# Patient Record
Sex: Female | Born: 1935 | Race: White | Hispanic: No | Smoking: Former smoker
Health system: Southern US, Community
[De-identification: ages and names within clinical notes are randomized; demographics above are authoritative.]

## PROBLEM LIST (undated history)

## (undated) DIAGNOSIS — I2510 Atherosclerotic heart disease of native coronary artery without angina pectoris: Secondary | ICD-10-CM

## (undated) DIAGNOSIS — C801 Malignant (primary) neoplasm, unspecified: Secondary | ICD-10-CM

## (undated) DIAGNOSIS — M1990 Unspecified osteoarthritis, unspecified site: Secondary | ICD-10-CM

## (undated) DIAGNOSIS — I679 Cerebrovascular disease, unspecified: Secondary | ICD-10-CM

## (undated) DIAGNOSIS — I701 Atherosclerosis of renal artery: Secondary | ICD-10-CM

## (undated) DIAGNOSIS — E785 Hyperlipidemia, unspecified: Secondary | ICD-10-CM

## (undated) DIAGNOSIS — I639 Cerebral infarction, unspecified: Secondary | ICD-10-CM

## (undated) DIAGNOSIS — I739 Peripheral vascular disease, unspecified: Secondary | ICD-10-CM

## (undated) DIAGNOSIS — Q438 Other specified congenital malformations of intestine: Secondary | ICD-10-CM

## (undated) HISTORY — PX: COLONOSCOPY W/ POLYPECTOMY: SHX1380

## (undated) HISTORY — DX: Atherosclerotic heart disease of native coronary artery without angina pectoris: I25.10

## (undated) HISTORY — DX: Peripheral vascular disease, unspecified: I73.9

## (undated) HISTORY — DX: Hyperlipidemia, unspecified: E78.5

## (undated) HISTORY — DX: Atherosclerosis of renal artery: I70.1

## (undated) HISTORY — DX: Cerebrovascular disease, unspecified: I67.9

---

## 1981-07-03 HISTORY — PX: OTHER SURGICAL HISTORY: SHX169

## 1994-07-03 HISTORY — PX: GALLBLADDER SURGERY: SHX652

## 2000-12-10 ENCOUNTER — Ambulatory Visit (HOSPITAL_COMMUNITY): Admission: RE | Admit: 2000-12-10 | Discharge: 2000-12-10 | Payer: Self-pay | Admitting: Pulmonary Disease

## 2001-11-11 ENCOUNTER — Ambulatory Visit (HOSPITAL_COMMUNITY): Admission: RE | Admit: 2001-11-11 | Discharge: 2001-11-11 | Payer: Self-pay | Admitting: Cardiology

## 2001-11-11 ENCOUNTER — Encounter: Payer: Self-pay | Admitting: Cardiology

## 2003-03-06 ENCOUNTER — Ambulatory Visit (HOSPITAL_COMMUNITY): Admission: RE | Admit: 2003-03-06 | Discharge: 2003-03-06 | Payer: Self-pay | Admitting: *Deleted

## 2003-03-06 ENCOUNTER — Encounter: Payer: Self-pay | Admitting: *Deleted

## 2004-02-16 ENCOUNTER — Ambulatory Visit (HOSPITAL_COMMUNITY): Admission: RE | Admit: 2004-02-16 | Discharge: 2004-02-16 | Payer: Self-pay | Admitting: Pulmonary Disease

## 2004-10-03 ENCOUNTER — Ambulatory Visit: Payer: Self-pay

## 2004-10-25 ENCOUNTER — Ambulatory Visit (HOSPITAL_COMMUNITY): Admission: RE | Admit: 2004-10-25 | Discharge: 2004-10-25 | Payer: Self-pay | Admitting: Pulmonary Disease

## 2004-12-01 HISTORY — PX: OTHER SURGICAL HISTORY: SHX169

## 2004-12-13 ENCOUNTER — Ambulatory Visit: Payer: Self-pay | Admitting: Cardiology

## 2004-12-21 ENCOUNTER — Ambulatory Visit: Payer: Self-pay | Admitting: Cardiology

## 2004-12-21 ENCOUNTER — Ambulatory Visit: Payer: Self-pay

## 2004-12-28 ENCOUNTER — Inpatient Hospital Stay (HOSPITAL_COMMUNITY): Admission: RE | Admit: 2004-12-28 | Discharge: 2005-01-03 | Payer: Self-pay | Admitting: Orthopedic Surgery

## 2004-12-29 ENCOUNTER — Ambulatory Visit: Payer: Self-pay | Admitting: Orthopedic Surgery

## 2005-03-01 ENCOUNTER — Ambulatory Visit: Payer: Self-pay | Admitting: Cardiology

## 2005-08-09 ENCOUNTER — Inpatient Hospital Stay (HOSPITAL_COMMUNITY): Admission: RE | Admit: 2005-08-09 | Discharge: 2005-08-11 | Payer: Self-pay | Admitting: Orthopedic Surgery

## 2005-10-18 ENCOUNTER — Ambulatory Visit: Payer: Self-pay

## 2005-12-01 ENCOUNTER — Ambulatory Visit: Payer: Self-pay | Admitting: Cardiology

## 2005-12-07 ENCOUNTER — Ambulatory Visit: Payer: Self-pay | Admitting: Cardiology

## 2006-01-15 ENCOUNTER — Ambulatory Visit (HOSPITAL_COMMUNITY): Admission: RE | Admit: 2006-01-15 | Discharge: 2006-01-15 | Payer: Self-pay | Admitting: Ophthalmology

## 2006-10-23 ENCOUNTER — Ambulatory Visit: Payer: Self-pay

## 2006-10-23 ENCOUNTER — Ambulatory Visit: Payer: Self-pay | Admitting: Cardiology

## 2006-10-30 ENCOUNTER — Emergency Department (HOSPITAL_COMMUNITY): Admission: EM | Admit: 2006-10-30 | Discharge: 2006-10-30 | Payer: Self-pay | Admitting: Emergency Medicine

## 2007-10-03 ENCOUNTER — Ambulatory Visit: Payer: Self-pay | Admitting: Cardiology

## 2007-10-17 ENCOUNTER — Ambulatory Visit: Payer: Self-pay

## 2008-04-06 ENCOUNTER — Ambulatory Visit: Payer: Self-pay | Admitting: Cardiology

## 2008-10-21 ENCOUNTER — Ambulatory Visit: Payer: Self-pay

## 2008-11-11 ENCOUNTER — Encounter: Payer: Self-pay | Admitting: Cardiology

## 2008-11-11 ENCOUNTER — Ambulatory Visit: Payer: Self-pay | Admitting: Cardiology

## 2008-12-07 ENCOUNTER — Telehealth (INDEPENDENT_AMBULATORY_CARE_PROVIDER_SITE_OTHER): Payer: Self-pay

## 2008-12-21 ENCOUNTER — Telehealth: Payer: Self-pay | Admitting: Cardiology

## 2008-12-30 DIAGNOSIS — E785 Hyperlipidemia, unspecified: Secondary | ICD-10-CM | POA: Insufficient documentation

## 2008-12-30 DIAGNOSIS — I701 Atherosclerosis of renal artery: Secondary | ICD-10-CM

## 2008-12-30 DIAGNOSIS — I679 Cerebrovascular disease, unspecified: Secondary | ICD-10-CM

## 2008-12-30 DIAGNOSIS — I2510 Atherosclerotic heart disease of native coronary artery without angina pectoris: Secondary | ICD-10-CM | POA: Insufficient documentation

## 2008-12-30 DIAGNOSIS — I739 Peripheral vascular disease, unspecified: Secondary | ICD-10-CM

## 2009-02-16 ENCOUNTER — Encounter: Payer: Self-pay | Admitting: Cardiology

## 2009-02-25 ENCOUNTER — Encounter: Payer: Self-pay | Admitting: Cardiology

## 2009-02-25 ENCOUNTER — Ambulatory Visit: Payer: Self-pay | Admitting: Cardiology

## 2009-02-25 DIAGNOSIS — R0989 Other specified symptoms and signs involving the circulatory and respiratory systems: Secondary | ICD-10-CM

## 2009-03-05 ENCOUNTER — Encounter: Payer: Self-pay | Admitting: Cardiology

## 2009-03-05 ENCOUNTER — Ambulatory Visit (HOSPITAL_COMMUNITY): Admission: RE | Admit: 2009-03-05 | Discharge: 2009-03-05 | Payer: Self-pay | Admitting: Cardiology

## 2009-04-20 ENCOUNTER — Ambulatory Visit: Payer: Self-pay | Admitting: Cardiology

## 2009-04-20 ENCOUNTER — Inpatient Hospital Stay (HOSPITAL_COMMUNITY): Admission: AD | Admit: 2009-04-20 | Discharge: 2009-04-24 | Payer: Self-pay | Admitting: Pulmonary Disease

## 2009-04-21 ENCOUNTER — Encounter: Payer: Self-pay | Admitting: Neurology

## 2009-05-28 ENCOUNTER — Ambulatory Visit: Payer: Self-pay | Admitting: Cardiology

## 2009-05-28 DIAGNOSIS — I2510 Atherosclerotic heart disease of native coronary artery without angina pectoris: Secondary | ICD-10-CM

## 2009-05-31 ENCOUNTER — Telehealth (INDEPENDENT_AMBULATORY_CARE_PROVIDER_SITE_OTHER): Payer: Self-pay

## 2009-07-05 ENCOUNTER — Telehealth: Payer: Self-pay | Admitting: Cardiology

## 2009-08-24 ENCOUNTER — Telehealth (INDEPENDENT_AMBULATORY_CARE_PROVIDER_SITE_OTHER): Payer: Self-pay | Admitting: *Deleted

## 2009-11-30 ENCOUNTER — Telehealth: Payer: Self-pay | Admitting: Cardiology

## 2009-12-10 ENCOUNTER — Telehealth: Payer: Self-pay | Admitting: Cardiology

## 2009-12-24 ENCOUNTER — Encounter: Payer: Self-pay | Admitting: Cardiology

## 2010-01-18 ENCOUNTER — Ambulatory Visit: Payer: Self-pay | Admitting: Cardiology

## 2010-03-22 ENCOUNTER — Emergency Department (HOSPITAL_COMMUNITY): Admission: EM | Admit: 2010-03-22 | Discharge: 2010-03-22 | Payer: Self-pay | Admitting: Emergency Medicine

## 2010-04-06 ENCOUNTER — Telehealth: Payer: Self-pay | Admitting: Cardiology

## 2010-04-18 ENCOUNTER — Observation Stay (HOSPITAL_COMMUNITY): Admission: EM | Admit: 2010-04-18 | Discharge: 2010-04-19 | Payer: Self-pay | Admitting: Emergency Medicine

## 2010-07-31 LAB — CONVERTED CEMR LAB
ALT: 14 units/L (ref 0–35)
Albumin: 4 g/dL (ref 3.5–5.2)
BUN: 12 mg/dL (ref 6–23)
Bilirubin, Direct: 0.2 mg/dL (ref 0.0–0.3)
CO2: 32 meq/L (ref 19–32)
Calcium: 9.3 mg/dL (ref 8.4–10.5)
Cholesterol: 132 mg/dL (ref 0–200)
GFR calc Af Amer: 88 mL/min
Glucose, Bld: 100 mg/dL — ABNORMAL HIGH (ref 70–99)
HDL: 59.7 mg/dL (ref >39.0)
Sodium: 140 meq/L (ref 135–145)
Total Protein: 6.5 g/dL (ref 6.0–8.3)
Triglycerides: 81 mg/dL (ref 0–149)
VLDL: 16 mg/dL (ref 0–40)

## 2010-08-04 NOTE — Letter (Signed)
Summary: progress note dr Juanetta Gosling 12-22-09  progress note dr Juanetta Gosling 12-22-09   Imported By: Faythe Ghee 12/24/2009 09:59:06  _____________________________________________________________________  External Attachment:    Type:   Image     Comment:   External Document

## 2010-08-04 NOTE — Progress Notes (Signed)
Summary: meds on back order - need rx to [facility]  Phone Note Refill Request Call back at [facility] Phone [phone] Message from:  Patient on Nov 30, 2009 12:41 PM  Refills Requested: Medication #1:  ACEON 8 MG TABS Take 2 tablet by mouth once a day  Method Requested: Fax to Local Pharmacy Reason for Call: Talk to Nurse Summary of Call: meds on back order. need an extended release. [facility] [phone] Initial call taken by: Lorne Skeens,  Nov 30, 2009 12:42 PM  Follow-up for Phone Call        Spoke with [facility] Wynona Canes ?)  RX given for pt to have Aceon 8 mg one by mouth two times a day # 60 with 3 refills.  Pt was not able to get it from mail order d/t back order.  pt aware Follow-up by: Charolotte Capuchin, RN,  Nov 30, 2009 1:22 PM

## 2010-08-04 NOTE — Progress Notes (Signed)
Summary: rx refill  Phone Note Call from Patient Call back at Home Phone [phone]   Caller: pt Reason for Call: Refill Medication Summary of Call: pt needs aceon 8mg  called in to [facility] and she would like #90 days instead of #30 Initial call taken by: Faythe Ghee,  April 06, 2010 10:13 AM    New/Updated Medications: ACEON 8 MG TABS (PERINDOPRIL ERBUMINE) Take 2 tablet by mouth once a day Prescriptions: ACEON 8 MG TABS (PERINDOPRIL ERBUMINE) Take 2 tablet by mouth once a day  #180 x 2   Entered by:   Larita Fife Via LPN   Authorized by:   Gaylord Shih, MD, [facility]   Signed by:   Larita Fife Via LPN on 09/81/1914   Method used:   Electronically to        [facility]* (retail)       [address]       [place], [place]  [address]       Ph: [phone]       Fax: [phone]   RxID:   [id number]

## 2010-08-04 NOTE — Progress Notes (Signed)
Summary: pt needs refill  Phone Note Refill Request Message from:  Patient on [facility]  Refills Requested: Medication #1:  VYTORIN 10-40 MG TABS Take 1 tablet by mouth every night  Medication #2:  PLETAL 100 MG TABS Take 1 tablet by mouth twice a day Initial call taken by: Omer Jack,  December 10, 2009 12:08 PM    Prescriptions: VYTORIN 10-40 MG TABS (EZETIMIBE-SIMVASTATIN) Take 1 tablet by mouth every night  #90 x 3   Entered by:   Danielle Rankin, CMA   Authorized by:   Gaylord Shih, MD, [facility]   Signed by:   Danielle Rankin, CMA on 12/10/2009   Method used:   Electronically to        [facility]* (mail-order)             ,          Ph: [phone]       Fax: [phone]   RxID:   [id number] PLETAL 100 MG TABS (CILOSTAZOL) Take 1 tablet by mouth twice a day  #90 x 3   Entered by:   Danielle Rankin, CMA   Authorized by:   Gaylord Shih, MD, [facility]   Signed by:   Danielle Rankin, CMA on 12/10/2009   Method used:   Electronically to        [facility]* (mail-order)             ,          Ph: [phone]       Fax: [phone]   RxID:   [id number]

## 2010-08-04 NOTE — Assessment & Plan Note (Signed)
Summary: Beto.Dent   Primary Provider:  Nehemiah Settle   History of Present Illness: Ruth Baker returns for E and M of her multiple vascular issues. She is miserable because of numerous restictions including dependency on a walker and not able to drive. She denies CP,angina, claudications or sxs of a TIA.  Current Medications (verified): 1)  Furosemide 20 Mg Tabs (Furosemide) .... Take 2 Tablet By Mouth Daily. 2)  Potassium Chloride Crys Cr 20 Meq Cr-Tabs (Potassium Chloride Crys Cr) .... Take One Tablet By Mouth Daily 3)  Aceon 8 Mg Tabs (Perindopril Erbumine) .... Take 2 Tablet By Mouth Once A Day 4)  Plavix 75 Mg Tabs (Clopidogrel Bisulfate) .... Take 1 Tablet By Mouth Once A Day 5)  Pletal 100 Mg Tabs (Cilostazol) .... Take 1 Tablet By Mouth Twice A Day 6)  Toprol Xl 50 Mg Xr24h-Tab (Metoprolol Succinate) .... Take 1 Tablet By Mouth Once A Day 7)  Vytorin 10-40 Mg Tabs (Ezetimibe-Simvastatin) .... Take 1 Tablet By Mouth Every Night 8)  Alprazolam 0.25 Mg Tabs (Alprazolam) .... Take As Needed 9)  Sertraline Hcl 50 Mg Tabs (Sertraline Hcl) .... Take 1 Tab Daily 10)  Aspirin 81 Mg Tbec (Aspirin) .... Take One Tablet By Mouth Daily 11)  Hydrocodone-Acetaminophen 5-500 Mg Tabs (Hydrocodone-Acetaminophen) .... As Needed 12)  Miralax  Powd (Polyethylene Glycol 3350) .... As Needed  Allergies (verified): 1)  ! Morphine 2)  ! Rocephin  Past History:  Past Medical History: Last updated: 12/30/2008 CEREBROVASCULAR DISEASE (ICD-437.9) PVD (ICD-443.9) HYPERLIPIDEMIA-MIXED (ICD-272.4) RENAL ARTERY STENOSIS (ICD-440.1) CAD, UNSPECIFIED SITE (ICD-414.00)    Past Surgical History: Last updated: 12/30/2008 Gallbladder surgery in 1996. Hysterectomy in 1983. Colonoscopy with polypectomy.  Left total hip in June of 2006  Family History: Last updated: 12/30/2008 Family History of Cancer:   Social History: Last updated: 12/30/2008 Full Time Widowed  Tobacco Use - Former.  Alcohol Use  - no  Risk Factors: Smoking Status: quit (12/30/2008)  Review of Systems       Negative other than HPI  Vital Signs:  Patient profile:   77 year old female Height:      64 inches Weight:      131 pounds BMI:     22.57 Pulse rate:   80 / minute Resp:     16 per minute BP sitting:   133 / 58  (left arm)  Vitals Entered By: Marrion Coy, CNA (January 18, 2010 1:58 PM)  Physical Exam  General:  Frail, elderly. NAD Head:  normocephalic and atraumatic Eyes:  PERRLA/EOM intact; conjunctiva and lids normal. Neck:  Neck supple, no JVD. No masses, thyromegaly or abnormal cervical nodes. Chest Maragret Vanacker:  no deformities or breast masses noted Lungs:  DECREASED BS THROUGHOUT. Heart:  Non-displaced PMI, chest non-tender; regular rate and rhythm, S1, S2 without murmurs, rubs or gallops. Carotid upstroke normal, right bruit Normal abdominal aortic size, no bruits. Femorals normal pulses, no bruits. Pedals normal pulses. No edema, no varicosities. Msk:  decreased ROM.   Pulses:  PT palpable Extremities:  Chronic edematous changes, Varicosities Neurologic:  Alert and oriented x 3. Skin:  Intact without lesions or rashes. Psych:  Normal affect.   Impression & Recommendations:  Problem # 1:  CAD, NATIVE VESSEL (ICD-414.01) Assessment Unchanged  Her updated medication list for this problem includes:    Aceon 8 Mg Tabs (Perindopril erbumine) .Marland Kitchen... Take 2 tablet by mouth once a day    Plavix 75 Mg Tabs (Clopidogrel bisulfate) .Marland Kitchen... Take 1 tablet by  mouth once a day    Pletal 100 Mg Tabs (Cilostazol) .Marland Kitchen... Take 1 tablet by mouth twice a day    Toprol Xl 50 Mg Xr24h-tab (Metoprolol succinate) .Marland Kitchen... Take 1 tablet by mouth once a day    Aspirin 81 Mg Tbec (Aspirin) .Marland Kitchen... Take one tablet by mouth daily  Problem # 2:  CAROTID BRUIT (ICD-785.9) Assessment: Unchanged  Problem # 3:  CEREBROVASCULAR DISEASE (ICD-437.9) Assessment: Unchanged  Problem # 4:  PVD (ICD-443.9) Assessment:  Unchanged  Problem # 5:  HYPERLIPIDEMIA-MIXED (ICD-272.4)  Her updated medication list for this problem includes:    Vytorin 10-40 Mg Tabs (Ezetimibe-simvastatin) .Marland Kitchen... Take 1 tablet by mouth every night  Problem # 6:  RENAL ARTERY STENOSIS (ICD-440.1) Assessment: Unchanged  Patient Instructions: 1)  Your physician recommends that you schedule a follow-up appointment in: 1 year 2)  Your physician recommends that you continue on your current medications as directed. Please refer to the Current Medication list given to you today.

## 2010-08-04 NOTE — Progress Notes (Signed)
Summary: rx pletal [facility]  Phone Note Refill Request Message from:  Patient on July 05, 2009 11:50 AM  Refills Requested: Medication #1:  PLETAL 100 MG TABS Take 1 tablet by mouth twice a day   Supply Requested: 3 months [facility] Mail Order   Method Requested: Fax to [facility] Initial call taken by: Migdalia Dk,  July 05, 2009 11:50 AM    Prescriptions: PLETAL 100 MG TABS (CILOSTAZOL) Take 1 tablet by mouth twice a day  #90 x 3   Entered by:   Danielle Rankin, CMA   Authorized by:   Gaylord Shih, MD, [facility]   Signed by:   Danielle Rankin, CMA on 07/05/2009   Method used:   Electronically to        [facility]* (mail-order)             ,          Ph: [phone]       Fax: [phone]   RxID:   [id number]

## 2010-08-04 NOTE — Progress Notes (Signed)
Summary: RX REFILLS  Phone Note Call from Patient Call back at Home Phone [phone]   Caller: PT Reason for Call: Refill Medication Summary of Call: ACEON 8MG , PLAVIX 75MG , TOPROL 50MG  ALL NEED CALLED IN TO [facility] Initial call taken by: Faythe Ghee,  August 24, 2009 10:51 AM    Prescriptions: TOPROL XL 50 MG XR24H-TAB (METOPROLOL SUCCINATE) Take 1 tablet by mouth once a day  #90 x 3   Entered by:   Teressa Lower RN   Authorized by:   Gaylord Shih, MD, [facility]   Signed by:   Teressa Lower RN on 08/24/2009   Method used:   Electronically to        [facility]* (mail-order)             ,          Ph: [phone]       Fax: [phone]   RxID:   [id number] PLAVIX 75 MG TABS (CLOPIDOGREL BISULFATE) Take 1 tablet by mouth once a day  #90 x 3   Entered by:   Teressa Lower RN   Authorized by:   Gaylord Shih, MD, [facility]   Signed by:   Teressa Lower RN on 08/24/2009   Method used:   Electronically to        [facility]* (mail-order)             ,          Ph: [phone]       Fax: [phone]   RxID:   [id number] ACEON 8 MG TABS (PERINDOPRIL ERBUMINE) Take 2 tablet by mouth once a day  #180 x 3   Entered by:   Teressa Lower RN   Authorized by:   Gaylord Shih, MD, [facility]   Signed by:   Teressa Lower RN on 08/24/2009   Method used:   Electronically to        [facility]* (mail-order)             ,          Ph: [phone]       Fax: [phone]   RxID:   [id number]

## 2010-09-10 ENCOUNTER — Telehealth: Payer: Self-pay | Admitting: Cardiology

## 2010-09-14 LAB — CBC
Hemoglobin: 12.9 g/dL (ref 12.0–15.0)
MCHC: 33.6 g/dL (ref 30.0–36.0)
RDW: 15.5 % (ref 11.5–15.5)
WBC: 7.6 10*3/uL (ref 4.0–10.5)

## 2010-09-14 LAB — CK: Total CK: 30 U/L (ref 7–177)

## 2010-09-14 LAB — BASIC METABOLIC PANEL
BUN: 18 mg/dL (ref 6–23)
Calcium: 9.5 mg/dL (ref 8.4–10.5)
GFR calc non Af Amer: 55 mL/min — ABNORMAL LOW (ref >60)
Glucose, Bld: 108 mg/dL — ABNORMAL HIGH (ref 70–99)
Potassium: 3.4 mEq/L — ABNORMAL LOW (ref 3.5–5.1)
Sodium: 142 mEq/L (ref 135–145)

## 2010-09-15 LAB — URINE MICROSCOPIC-ADD ON

## 2010-09-15 LAB — URINALYSIS, ROUTINE W REFLEX MICROSCOPIC
Bilirubin Urine: NEGATIVE
Glucose, UA: NEGATIVE mg/dL
Hgb urine dipstick: NEGATIVE
Ketones, ur: NEGATIVE mg/dL
Nitrite: NEGATIVE
Protein, ur: NEGATIVE mg/dL
Specific Gravity, Urine: 1.015 (ref 1.005–1.030)
Urobilinogen, UA: 0.2 mg/dL (ref 0.0–1.0)
pH: 5.5 (ref 5.0–8.0)

## 2010-09-15 LAB — URINE CULTURE
Colony Count: >=100000
Culture  Setup Time: 201109210134

## 2010-09-20 NOTE — Progress Notes (Signed)
  Phone Note Other Incoming   Caller: [facility] drug information forms Summary of Call: Consideration for your Review per [facility]:  [facility] wanted to bring to your attention and clarify that Ruth Baker was on both plavix and pletal both platelet aggregation inhibitors. Pt has been on both  since 2010,  if making med changes please send note back to me  Initial call taken by: Teressa Lower RN,  September 10, 2010 2:50 PM  Follow-up for Phone Call        ok with me Follow-up by: Gaylord Shih, MD, [facility],  September 13, 2010 11:52 AM

## 2010-10-06 LAB — BASIC METABOLIC PANEL
CO2: 26 mEq/L (ref 19–32)
GFR calc Af Amer: >60 mL/min (ref >60)
Glucose, Bld: 100 mg/dL — ABNORMAL HIGH (ref 70–99)
Potassium: 3.8 mEq/L (ref 3.5–5.1)
Sodium: 142 mEq/L (ref 135–145)

## 2010-10-06 LAB — CBC
HCT: 41.8 % (ref 36.0–46.0)
Hemoglobin: 14.2 g/dL (ref 12.0–15.0)
MCHC: 34 g/dL (ref 30.0–36.0)
MCV: 94.3 fL (ref 78.0–100.0)
RDW: 15.2 % (ref 11.5–15.5)

## 2010-10-06 LAB — CARDIAC PANEL(CRET KIN+CKTOT+MB+TROPI): Total CK: 51 U/L (ref 7–177)

## 2010-10-06 LAB — COMPREHENSIVE METABOLIC PANEL
BUN: 11 mg/dL (ref 6–23)
Calcium: 9.1 mg/dL (ref 8.4–10.5)
Creatinine, Ser: 0.82 mg/dL (ref 0.4–1.2)
GFR calc non Af Amer: >60 mL/min (ref >60)
Glucose, Bld: 113 mg/dL — ABNORMAL HIGH (ref 70–99)
Sodium: 143 mEq/L (ref 135–145)
Total Protein: 6.7 g/dL (ref 6.0–8.3)

## 2010-10-06 LAB — URINALYSIS, ROUTINE W REFLEX MICROSCOPIC
Bilirubin Urine: NEGATIVE
Nitrite: NEGATIVE
Specific Gravity, Urine: 1.01 (ref 1.005–1.030)
pH: 6 (ref 5.0–8.0)

## 2010-10-06 LAB — DIFFERENTIAL
Lymphocytes Relative: 28 % (ref 12–46)
Lymphs Abs: 2.1 10*3/uL (ref 0.7–4.0)
Monocytes Relative: 7 % (ref 3–12)
Neutro Abs: 4.7 10*3/uL (ref 1.7–7.7)
Neutrophils Relative %: 63 % (ref 43–77)

## 2010-10-06 LAB — URINE CULTURE
Colony Count: 15000
Special Requests: NEGATIVE

## 2010-10-06 LAB — URINE MICROSCOPIC-ADD ON

## 2010-11-01 ENCOUNTER — Telehealth: Payer: Self-pay | Admitting: *Deleted

## 2010-11-01 MED ORDER — CILOSTAZOL 100 MG PO TABS: 100.0000 mg | ORAL_TABLET | Freq: Two times a day (BID) | ORAL | Status: DC

## 2010-11-01 NOTE — Telephone Encounter (Signed)
Received refill for Cilostazol 100mg   90/2.  I called pt and clarified medication and dosage.  She is taking Pletal 100mg  bid.  I will fax 180/3 to [facility] for pt.  Pt also due for yearly with Dr. Daleen Squibb.  Appt made for LRD on 12/21/10. Mylo Red RN

## 2010-11-15 NOTE — Assessment & Plan Note (Signed)
[facility]                       [place] CARDIOLOGY OFFICE NOTE   Ruth Baker, Ruth Baker                        MRN:          [id number]  DATE:11/11/2008                            DOB:          [birth year]    Ruth Baker comes in today for followup.   Other than multiple arthritic complaints, she is doing well.  She is  having no angina or ischemic symptoms.  She has had no symptoms of TIAs  or mini strokes.  Her blood pressure has been under good control.  She  is due for blood work, which we checked on last year.  She is not having  any claudication.   Her problem list, see my note from October 03, 2007.   CURRENT MEDICATIONS:  1. HCTZ 25 mg per day.  2. Plavix 75 mg per day.  3. Toprol-XL 50 mg a day.  4. Pletal 100 mg p.o. b.i.d.  5. Potassium 10 mEq p.o. daily.  6. Zoloft 50 mg p.o. daily.  7. Vytorin 10/40 daily.  8. Alprazolam 0.25 two daily.  9. Aceon 16 mg per day.   PHYSICAL EXAMINATION:  VITAL SIGNS:  Blood pressure today is 124/78,  pulse is 60 and regular.  Her weight is 143 pounds.  GENERAL:  She is alert and oriented, quite humorous.  SKIN:  Very thin and ecchymotic particularly upper extremities.  HEENT:  Unchanged.  NECK:  Supple.  Carotids upstrokes are equal bilaterally without  significant bruits.  Thyroid is not enlarged.  Trachea is midline.  LUNGS:  Clear to auscultation and percussion.  No rhonchi or rales.  HEART:  Reveals a regular rate and rhythm.  No gallop, murmur, or rub.  ABDOMEN:  Soft, good bowel sounds.  No midline bruit.  No hepatomegaly.  EXTREMITIES:  Reveals ecchymoses.  Her pulses were trace bilaterally in  the lower extremities.  She has 2+ pitting edema pretibially.  There is  no sign of DVT.  NEUROLOGIC:  Intact.   Carotid Dopplers were done last week in the [facility].  We are  pulling those for now.  If there has been no change, we will make no  changes in her medical program.   I am  concerned about her edema, and I will switch her HCTZ to furosemide  20 mg p.o. q.a.m.  We will increase her potassium 20 mEq daily.   We will put her in for a CMP and lipid profile next week for followup.   Assuming these are stable, I will see her back in 6 months.     Thomas C. Daleen Squibb, MD, [facility]  Electronically Signed    TCW/MedQ  DD: 11/11/2008  DT: 11/12/2008  Job #: [id number]   cc:   Ramon Dredge L. Juanetta Gosling, M.D.

## 2010-11-15 NOTE — Assessment & Plan Note (Signed)
[facility]                            CARDIOLOGY OFFICE NOTE   Ruth Baker, Ruth Baker                        MRN:          [id number]  DATE:10/03/2007                            DOB:          29-Nov-1920    [name] comes today for followup.   PROBLEM LIST:  1. Coronary artery disease.  She has normal left ventricular systolic      function.  EF 70%.  Last Myoview was December 22, 2004.  She is      asymptomatic.  We have been treating her medically.  2. Cerebrovascular disease.  She has 69% stenosis on the right, 0-30%      on left, antegrade flow in both vertebral.  She is due followup.      She is having no symptoms of TIA or threatened stroke.  3. Renal artery stenosis.  Blood pressures have actually been good.  I      do not have a recent creatinine.  4. Hyperlipidemia.  She says she has not had any blood work done in      over a year.  She is supposed to be following along with Dr.      Juanetta Gosling.  5. Peripheral vascular disease which is currently asymptomatic.   Her biggest complaint is that her legs just ache all over.  She can just  touch them and they hurt.  They even hurt when she puts on her hose.   She denies any true claudication.  She has really had no muscle  soreness.  She is not diabetic.  I have made no change in her  medications.  She denies any orthopnea, PND, peripheral edema.  She has  had no angina.   Her medicines are hydrochlorothiazide 25 mg a day, Plavix 75 mg a day,  Toprol XL 50 mg a day, Pletal 100 mg p.o. b.i.d., potassium 10 mg a day,  Zoloft 50 mg a day, Vytorin 10/40 daily, alprazolam 0.25 two daily,  Aceon 16 mg a day.   EXAM:  Her blood pressure 130/76, her pulse 71 and regular.  Weight is  146 down 3.  She is a very pleasant elderly lady in no acute distress.  HEENT:  PERRL.  Extraocular is intact.  She has arcus senilis.  Sclerae  are slightly injected.  Carotid upstrokes were equal bilaterally with soft bruit on  the right.  No JVD.  Thyroid is not enlarged.  Trachea is midline.  She has a scar  from the inside lining of her mandible down to her upper left neck from  skin cancer excision.  It is well-healed.  Her lungs reveal decreased breath sounds throughout.  There are no  rhonchi or wheezes.  HEART:  Reveals nondisplaced PMI.  She has normal S1-S2 without gallop  or rub.  ABDOMEN:  Soft, good bowel sounds.  There is no obvious midline bruit or  pulsatile mass.  No organomegaly.  EXTREMITIES:  Reveal her pulses to be 1+/4+ bilaterally symmetrical.  She has some varicose veins.  There is no sign of DVT.  There is  really  not a lot of tenderness to touch.  NEURO:  Exam is intact.  Musculoskeletal shows a lot of chronic arthritic changes, particularly  of her left shoulder which is deformed.   EKG is essentially normal except for left axis deviation.  There has  been no change.   Ruth Baker is stable despite her diffuse vascular disease and multiple  complaints.  She says, I think uncle Merton Border came to visit and did not  leave.   At this point, we will obtain blood work since it has been some time and  she is requesting [place] to do so.  We will get a comprehensive metabolic  panel and lipid panel today.  In addition, we have renewed all her  medications through [facility].  We also set up for carotid Dopplers.  Assuming she is stable, I will see her back in 6 months.     Thomas C. Daleen Squibb, MD, [facility]  Electronically Signed    TCW/MedQ  DD: 10/03/2007  DT: 10/03/2007  Job #: [id number]   cc:   Ramon Dredge L. Juanetta Gosling, M.D.

## 2010-11-15 NOTE — Assessment & Plan Note (Signed)
[facility] HEALTHC[facility]                            CARDIOLOGY OFFICE NOTE   Ruth Baker, Ruth Baker                        MRN:          [id number]  DATE:04/06/2008                            DOB:          03-30-1921    Ruth Baker comes in today for followup.  Please see my problem list of  October 03, 2007.   Other than just multiple arthritic complaints particularly in left  shoulder and left arm, she has been doing well.  She has no symptoms of  angina or dyspnea on exertion.  She is fairly limited in her mobility,  however.   She had no symptoms of TIAs or threatened stroke.   CURRENT MEDICATIONS:  1. Hydrochlorothiazide 25 mg a day.  2. Plavix 75 mg a day.  3. Toprol-XL 50 mg a day.  4. Pletal 100 mg p.o. b.i.d.  5. Potassium 10 mEq daily.  6. Zoloft 50 mg daily.  7. Vytorin 10/40 daily.  8. Alprazolam 0.25 two daily.  9. Aceon 16 mg a day.  10.Cilostazol 100 mg a day.   PHYSICAL EXAMINATION:  VITAL SIGNS:  Blood pressure 140/80.  Her pulse  is 68 and regular.  Weight is 142 which is down a few.  GENERAL:  She is chronically ill, very pleasant, alert and oriented, has  a good sense of humor despite her in multiple complaints and problems.  NECK:  Carotids are full.  She has soft bilateral bruits.  Thyroid is  not enlarged.  Trachea is midline.  LUNGS:  Clear to auscultation and percussion.  HEART:  Regular rate and rhythm.  No gallop, rub, or murmur.  ABDOMEN:  Soft, good bowel sounds.  EXTREMITIES:  No edema.  Pulses were trace bilaterally in the lower  extremities.  Her toes are warm.  There is no sign of ulceration or  ulcers.   ASSESSMENT/PLAN:  Ruth Baker is stable from our standpoint.  We are trying  to treat her medically.  We have made no changes in medical program.  I  will see her back again in 6 months.     Thomas C. Daleen Squibb, MD, [facility]  Electronically Signed    TCW/MedQ  DD: 04/06/2008  DT: 04/07/2008  Job #: [id number]   cc:   Ramon Dredge L.  Juanetta Gosling, M.D.

## 2010-11-17 ENCOUNTER — Telehealth: Payer: Self-pay | Admitting: Cardiology

## 2010-11-17 NOTE — Telephone Encounter (Signed)
Pt is on Plavix and needs to know how long to be off before dental work can be done.  Please fax note to dentist indicating the number of days needed.  Fax for Dr. Melvyn Neth [phone].

## 2010-11-17 NOTE — Telephone Encounter (Signed)
Pt is having dental procedure on June 12th.  She has a couple of loose teeth and also needs fillings.  She is feeling well except for her hip.  She had a "light stroke last year before Christmas. My vision is better in my left eye".   Needs to know how long to be off Plavix before dental procedure.  She is aware Dr. Daleen Squibb is out of the office today.  Mylo Red RN

## 2010-11-18 NOTE — Op Note (Signed)
NAMETYLEAH, LOH NO.:  [id number]   MEDICAL RECORD NO.:  [id number]          PATIENT TYPE:  INP   LOCATION:  0004                         FACILITY:  [facility]   PHYSICIAN:  Ollen Gross, M.D.    DATE OF BIRTH:  1920-10-25   DATE OF PROCEDURE:  08/09/2005  DATE OF DISCHARGE:                                 OPERATIVE REPORT   PREOPERATIVE DIAGNOSIS:  Osteoarthritis right shoulder.   POSTOPERATIVE DIAGNOSIS:  Osteoarthritis right shoulder.   PROCEDURE:  Right shoulder hemiarthroplasty.   SURGEON:  Ollen Gross, M.D.   ASSISTANT:  Alexzandrew L. Julien Girt, P.A.   ANESTHESIA:  General plus interscalene block.   ESTIMATED BLOOD LOSS:  200.   DRAINS:  Hemovac x1.   COMPLICATIONS:  None.   CONDITION:  Stable to recovery.   CLINICAL NOTE:  Ruth Baker is an 77 year old female who has significant end-  stage arthritis of the right shoulder. She has had intractable pain  refractory to nonoperative management including injections. She presents now  for right shoulder hemiarthroplasty due to intractable pain. We discussed  the possibility of hemi versus total shoulder arthroplasty depending on  intraoperative findings.   PROCEDURE IN DETAIL:  After successful administration of interscalene block  and general anesthetic, the patient is placed on the operating table in a  semi beach chair position. Her right upper extremity is then isolated from  her trunk with plastic drapes and prepped and draped in the usual sterile  fashion. An incision is then made along the deltopectoral interval starting  with a 10 blade through the skin to the subcutaneous tissue. The interval  between the deltoid and pectoralis is identified. She did not have a visible  cephalic vein. We explored the interval and got down to the clavipectoral  fascia which was incised. I incised the upper 15% of the pectoralis tendon  to provide better exposure. The conjoined tendon was identified and  retracted medially. The retraction is done just under the coracoid so as to  avoid the coracobrachialis nerve. A vertical incision is then made in the  subscapularis and the medial side tagged with Ethibond sutures. The shoulder  is subsequently dislocated. I externally rotated about 30 degrees to get 30  degrees of retroversion and placed a cutting guide. The line is marked for  resection and then with the arm externally rotated 30 degrees resection is  made with an oscillating saw. We then reamed the canal up to a size 10  subsequently broaching to a 10 with excellent torsional stability. The size  10 global shoulder hemiarthroplasty was then impacted into the humerus with  excellent fit along the cut bone surface. It was very stable both in axial  direction and to torsional stressing. The trial 44 head is then placed  starting with the 44 x 18. It did not have enough offset so I went to a 44 x  21. The shoulder is reduced and has excellent stability with about 50%  posterior and 50% inferior translation. It restored her offset nicely. The  permanent 44 x 21 head is placed.  Prior to this, I had inspected the glenoid  and the glenoid was concentric. There was still cartilage left in the  glenoid. Due to the fact that it was concentric and there was still some  cartilage left, I decided not to resurface the glenoid and we just went with  a hemiarthroplasty as opposed to the total shoulder. Once the head is placed  and the shoulder is reduced, the wound is  then copiously irrigated with  saline solution. The subscap is then repaired with interrupted #1 Ethibond  sutures. The deltopectoral interval is then tacked closed with #1 Vicryl. I  placed a Hemovac drain prior to this. The subcu was closed with interrupted  2-0 Vicryl and subcuticular with running 4-0 Monocryl. Incision is cleaned  and dried and Steri-Strips and a bulky sterile dressing applied .She was  placed into a shoulder  immobilizer, awakened and transported to recovery in  stable condition.      Ollen Gross, M.D.  Electronically Signed     FA/MEDQ  D:  08/09/2005  T:  08/10/2005  Job:  [id number]

## 2010-11-18 NOTE — Discharge Summary (Signed)
NAMEMYKAH, BELLOMO                 ACCOUNT NO.:  [id number]   MEDICAL RECORD NO.:  [id number]          PATIENT TYPE:  INP   LOCATION:  1519                         FACILITY:  [facility]   PHYSICIAN:  Ollen Gross, M.D.    DATE OF BIRTH:  22-Sep-1920   DATE OF ADMISSION:  08/09/2005  DATE OF DISCHARGE:  08/11/2005                                 DISCHARGE SUMMARY   ADMISSION DIAGNOSES:  1.  Osteoarthritis, right shoulder.  2.  Anxiety.  3.  Hypertension.  4.  Coronary arterial disease.  5.  Cerebrovascular disease.  6.  Nonobstructive bilateral carotid arterial disease.  7.  Peripheral vascular disease.  8.  Hyperlipidemia.  9.  History of Bright's disease as an infant.  10. History of blood clot in leg.   DISCHARGE DIAGNOSES:  1.  Osteoarthritis, right shoulder, status post right shoulder      hemiarthroplasty.  2.  Anxiety.  3.  Hypertension.  4.  Coronary arterial disease.  5.  Cerebrovascular disease.  6.  Nonobstructive bilateral carotid arterial disease.  7.  Peripheral vascular disease.  8.  Hyperlipidemia.  9.  History of Bright's disease as an infant.  10. History of blood clot in leg.   PROCEDURE:  On August 09, 2005, right shoulder hemiarthroplasty.   SURGEON:  Ollen Gross, M.D.   ASSISTANT:  Alexzandrew L. Perkins, P.A.-C.   ANESTHESIA:  General with interscalene block.   CONSULTS:  None.   BRIEF HISTORY:  Ms. Brucato is an 77 year old female with significant end-stage  arthritis of the right shoulder with intractable pain, refractory to  nonoperative management, including injections.  Now presents for a right  shoulder hemiarthroplasty.   LABORATORY DATA:  CBC on admission:  Hemoglobin 13.4, hematocrit 39.8.  Normal white count 7.1.  Postoperative CBC showed a hemoglobin of 11.4 and  33.1.  PT/PTT on admission 13.5 and 36, respectively with an INR of 1.1.  Chem panel on admission all within normal limits.  Serial BMETs are  followed:  Potassium dropped  from 3.9 to 3.4.  Glucose went up from 105 to  142.  Preop UA:  Small leukocyte esterase with only a few epithelial cells,  0-2 white cells, a few bacteria.  Blood group type A+.   HOSPITAL COURSE:  Admitted to [facility] tolerated the procedure  well.  Later transferred to the recovery room and to the orthopedic floor  for continued postop care.  Started on p.o. and IV push analgesics for pain  control.  Was actually doing pretty well on day #1.  Did have pain  throughout the night.  Was encouraging pain medications.  Started to get up  with physical therapy.  Pressures had been running a little higher through  the night, most likely due to pain.  Hemovac drain, which was placed at the  time of surgery, was removed without difficulty.  Decrease of fluids.  Began  therapy.  O2 had been ordered postoperatively.  Recommended no active motion  or external rotations.  Started pendulum and ADLs.  Did well on day #1.  By  day #2, blood pressures were under better control.  The pain was under  better control.  The patient was ready to go home.  Set up home health and  was discharged following arrangements.   DISCHARGE PLAN:  1.  Patient was discharged home on August 11, 2005.  2.  Discharge diagnoses:  Please see above.  3.  Discharge meds:  Percocet, Robaxin.  4.  Diet:  As tolerated.  5.  Follow up next Thursday following discharge.  6.  Activity:  Home health PT/OT.  Passive range of motion only.  No active      range of motion.  No external rotation.   DISPOSITION:  Home.   CONDITION ON DISCHARGE:  Improved.      Alexzandrew L. Julien Girt, P.A.      Ollen Gross, M.D.  Electronically Signed    ALP/MEDQ  D:  09/19/2005  T:  09/19/2005  Job:  [id number]   cc:   Thomas C. Wall, M.D.  [address]  [address]  [place]  [place] [address]   Oneal Deputy. Juanetta Gosling, M.D.  Fax: [phone]

## 2010-11-18 NOTE — Op Note (Signed)
NAMEJODEL, MAYHALL NO.:  [id number]   MEDICAL RECORD NO.:  [id number]          PATIENT TYPE:  INP   LOCATION:  1614                         FACILITY:  [facility]   PHYSICIAN:  Ollen Gross, M.D.    DATE OF BIRTH:  May 25, 1921   DATE OF PROCEDURE:  12/28/2004  DATE OF DISCHARGE:                                 OPERATIVE REPORT   PREOPERATIVE DIAGNOSIS:  Osteoarthritis left hip.   POSTOPERATIVE DIAGNOSES:  Osteoarthritis left hip.   PROCEDURE:  Left total hip arthroplasty.   SURGEON:  Ollen Gross, M.D.   ASSISTANT:  Alexzandrew L. Julien Girt, P.A.   ANESTHESIA:  Spinal.   ESTIMATED BLOOD LOSS:  250.   DRAINS:  Hemovac x1.   COMPLICATIONS:  None.   CONDITION:  Stable to recovery.   BRIEF CLINICAL NOTE:  Ruth Baker is an 77 year old female who has severe end-  stage arthritis of the left hip with intractable pain. She presents now for  left total hip arthroplasty.   DESCRIPTION OF PROCEDURE:  After successful administration of spinal  anesthetic, the patient is placed in the right lateral decubitus position  with the left side up and held with the hip positioner. The left lower  extremity was isolated from her perineum with plastic drapes and prepped and  draped in the usual sterile fashion. A short posterolateral incision was  made with a 10 blade through the subcutaneous tissue to the level of the  fascia lata which is incised in line with the skin incision. The sciatic  nerve is palpated and protected and the short rotator is isolated off the  femur. Capsulectomy is performed and the hip is dislocated. The center of  the femoral head is marked and a trial prosthesis placed such that the  center of the trial head corresponds to the center of her native femoral  head. Osteotomy lines marked on the femoral neck and osteotomy is made with  an oscillating saw. Femoral head is removed and then the femur retracted  anteriorly to gain acetabular exposure.   Acetabular labrum and osteophytes are removed. Her acetabulum was slightly  dysplastic. Reaming is performed up to 53 mm and then a 54 mm pinnacle  acetabular shell was placed in anatomic position and transfixed with dome  screws. The superolateral 10% of the cup is uncovered because of her  dysplasia, but the positioning is anatomic. A trial 32 mm neutral liner is  placed.   Femur is prepared first with the canal finder and then irrigation. Axial  reaming is performed to 13.5 mm, proximal reaming to an 46F and the sleeve  machined to a large. 46F large trial sleeve is placed with an 18 x 13 stem,  a 36 plus 8 neck matching her native anteversion. A trial 32 plus 0 head is  placed. The hip is reduced with excellent stability, full extension, full  external rotation, 70 degrees flexion, 40 degrees adduction, 90 degrees  internal rotation then 90 degrees flexion and 70 degrees internal rotation.  The trials were then removed and the permanent apex hole eliminator was  placed into the acetabular shell. A permanent 32 mm neutral marathon liner  was placed. The permanent 53F large sleeve is placed with the 18 x 13 stem  and a 36 plus 8 neck matching her native anteversion. The permanent 32 plus  0 head was placed and the hip is reduced with the same stability parameters.  The wound is copiously irrigated with saline solution and short rotators  reattached to the femur through drill holes. The fascia lata was closed over  a Hemovac drain with interrupted #1 Vicryl, subcu closed with  #1 and 2-0  Vicryl and subcuticular running 4-0 Monocryl. Incision is cleaned and dried  and Steri-Strips and a bulky sterile dressing applied. The drain is hooked  to suction. She is placed into a knee immobilizer, awakened and transferred  to recovery in stable condition.       FA/MEDQ  D:  12/28/2004  T:  12/28/2004  Job:  [id number]

## 2010-11-18 NOTE — H&P (Signed)
Ruth Baker, Ruth Baker NO.:  [id number]   MEDICAL RECORD NO.:  [id number]          PATIENT TYPE:  INP   LOCATION:  1519                         FACILITY:  [facility]   PHYSICIAN:  Ollen Gross, M.D.    DATE OF BIRTH:  18-May-1921   DATE OF ADMISSION:  08/09/2005  DATE OF DISCHARGE:                                HISTORY & PHYSICAL   Date of office visit, history and physical August 01, 2005. Date of  admission August 09, 2005.   CHIEF COMPLAINT:  Right shoulder pain.   HISTORY OF PRESENT ILLNESS:  The patient is an 77 year old female well known  to Dr. Homero Fellers Aluisio. She has previously undergone a left total hip  arthroplasty in the past. She continues to have right shoulder pain. She is  known to have significant arthritis in the right shoulder with bone-on-bone  changes that is limiting her function and causing moderate to severe pain.  It is felt that she has reached a point where she could benefit from  undergoing hemiarthroplasty. Risks and benefits of this procedure have been  discussed with the patient. She elected to proceed with surgery.   ALLERGIES:  ROCEPHIN causes hives. Also MOTRIN.   CURRENT MEDICATIONS:  1.  Alprazolam 0.25 milligrams b.i.d.  2.  Vytorin 10/40 p.o. q.p.m.  3.  Plavix 75 milligrams p.o. q.a.m.  4.  Pletal 100 milligrams p.o. b.i.d.  5.  Zoloft 50 milligrams daily.  6.  Hydrocodone 5/500 milligrams b.i.d.  7.  Naproxen 500 milligrams b.i.d.  8.  Toprol XL 50 milligrams p.o. q.a.m.  9.  Aceon 8 milligrams p.o. b.i.d.  10. Hydrochlorothiazide 25 milligrams p.o. daily.   PAST MEDICAL HISTORY:  1.  Anxiety.  2.  Hypertension.  3.  Coronary artery disease.  4.  Cerebrovascular disease.  5.  Nonobstructive bilateral carotid arterial disease.  6.  Peripheral vascular disease.  7.  Hyperlipidemia.  8.  History of blood clot in leg.  9.  History of Bright's disease as an infant.   PAST SURGICAL HISTORY:  1.  Gallbladder surgery  in 1996.  2.  Hysterectomy in 1983.  3.  Colonoscopy with polypectomy.  4.  Left total hip in June of 2006.   SOCIAL HISTORY:  Widowed, retired, nonsmoker, no alcohol, two children (two  daughters).   FAMILY HISTORY:  Sister with history of breast cancer. Sister with history  of bone cancer.   REVIEW OF SYSTEMS:  GENERAL: No fever, chills, or night sweats.  NEUROLOGICAL: No seizures, syncope or paralysis. RESPIRATORY: No shortness  of breath, productive cough or hemoptysis. CARDIOVASCULAR: Noted for cardiac  disease, vessel disease but denies any chest pain, angina or orthopnea. GI:  No nausea, vomiting, diarrhea, or constipation. GU: No dysuria, hematuria or  discharge. MUSCULOSKELETAL: Right shoulder.   PHYSICAL EXAMINATION:  VITAL SIGNS: Pulse 76, respirations 16, blood  pressure 118/60.  GENERAL: An 77 year old white female well-developed, well-nourished in no  acute distress. She is alert and oriented. Very pleasant at the time of my  exam. She is accompanied by her daughter.  HEENT: Normocephalic and  atraumatic. Pupils are equal, round, and reactive.  Oropharynx clear. EOMs intact.  NECK: Supple. She does have some faint bilateral carotid bruits.  CHEST: Clear anterior and posterior chest wall. No rhonchi, rubs, or  wheezing.  HEART: Regular rhythm with a grade 2/6 systolic ejection murmur best heard  over pulmonic point and Erb's point.  ABDOMEN: Soft and nontender. Bowel sounds present.  RECTAL: Not done, not pertinent to present illness.  BREASTS: Not done, not pertinent to present illness.  GENITALIA: Not done, not pertinent to present illness.  EXTREMITIES: Right upper extremity shows motor function is intact throughout  the right upper extremity. She does have limited motion due to pain. She has  mild crepitus noted on passive range of motion.   IMPRESSION:  1.  Osteoarthritis right shoulder.  2.  Anxiety.  3.  Hypertension.  4.  Coronary arterial disease.  5.   Cerebrovascular disease.  6.  Nonobstructive bilateral carotid arterial disease.  7.  Peripheral vascular disease.  8.  Hyperlipidemia.  9.  History of Bright's disease as an infant.  10. History of blood clot in the leg.   PLAN:  The patient is admitted to [facility]. She will undergo a  right shoulder hemiarthroplasty. Surgery will be performed by Dr. Ollen Gross.      Alexzandrew L. Julien Girt, P.A.      Ollen Gross, M.D.  Electronically Signed    ALP/MEDQ  D:  08/10/2005  T:  08/11/2005  Job:  [id number]   cc:   Ollen Gross, M.D.  Fax: [phone]   Jesse Sans. Wall, M.D.  [address]  [address]  [place]  [place] [address]   Oneal Deputy. Juanetta Gosling, M.D.  Fax: [phone]

## 2010-11-18 NOTE — H&P (Signed)
NAMEARMIDA, Ruth Baker NO.:  [id number]   MEDICAL RECORD NO.:  [id number]          PATIENT TYPE:  INP   LOCATION:  1614                         FACILITY:  [facility]   PHYSICIAN:  Ollen Gross, M.D.    DATE OF BIRTH:  1920-11-01   DATE OF ADMISSION:  12/28/2004  DATE OF DISCHARGE:                                HISTORY & PHYSICAL   CHIEF COMPLAINT:  Left hip pain.   HISTORY OF PRESENT ILLNESS:  The patient is an 77 year old female seen by  Dr. Lequita Halt for left hip pain.  She has a 2-year progressive history of left  hip pain and left hip dysfunction.  Her main complaints about the hip is it  giving out and making her fall.  She is seen in the office accompanied by  her daughters.  They state this has been progressively getting worse and is  limiting what she can and cannot do.  Chest pain mainly in the groin  radiating down through her thigh.  She was seen in the office.  X-rays were  taken and showed significant end-stage arthritis of the left hip with bone-  on-bone and some lateral subluxation of the femoral head.  She was found to  have significant degenerative joint disease in the hip, and it is felt she  would benefit from undergoing surgical intervention.  Risks and benefits  discussed.  The patient was subsequently admitted to the hospital.  She has  been worked up by Dr. Daleen Squibb from a cardiac standpoint preoperatively, and has  also been seen by Dr. Juanetta Gosling and felt that she has no contraindications for  her up and coming surgery.   ALLERGIES:  ROCEPHIN.   CURRENT MEDICATIONS:  1.  Hydrocodone.  2.  Alprazolam.  3.  Naproxen.  4.  Pletal.  5.  Plavix.  6.  Toprol-XL.  7.  Hydrochlorothiazide.  8.  Zoloft.  9.  Potassium chloride.  10. Aceon.  11. Vytorin.   PAST MEDICAL HISTORY:  1.  Anxiety.  2.  Hypertension.  3.  Coronary arterial disease.  4.  Cerebrovascular disease.  5.  Non-obstructive bilateral carotid arterial disease.  6.  Peripheral  vascular disease.  7.  Hyperlipidemia.   PAST SURGICAL HISTORY:  1.  Gallbladder surgery in 1996.  2.  Hysterectomy in 1993.  3.  Also has undergone a colonoscopy with polypectomy.   SOCIAL HISTORY:  Widowed, retired, nonsmoker.  No alcohol.  Has 2 daughters.   FAMILY HISTORY:  Sister with breast cancer, sister with bone cancer.   REVIEW OF SYSTEMS:  GENERAL:  No fevers, chills, or night sweats.  She has  had falls, which she attributes to her hip.  No seizures or syncope,  although she does have some cerebrovascular disease.  RESPIRATORY:  No  shortness of breath, productive cough, or hemoptysis.  CARDIOVASCULAR:  Significant cardiac disease and vessel disease.  Denies any chest pain,  angina, or orthopnea.  GI:  No nausea, vomiting, diarrhea, or constipation.  GU:  No dysuria, hematuria, discharge.  MUSCULOSKELETAL:  Left hip as found  in the  history of present illness.   PHYSICAL EXAMINATION:  VITAL SIGNS:  Pulse 68, respirations 12, blood  pressure 180/88.  GENERAL:  An 77 year old white female, well-nourished, well-developed, in no  acute distress.  She is alert, oriented, and cooperative.  Very pleasant at  the time of the exam.  She is accompanied by her family.  HEENT:  Normocephalic and atraumatic.  Pupils equal, round and reactive to  light.  Oropharynx clear.  Extraocular movements are intact.  NECK:  Supple.  She does have bilateral frank carotid bruits bilaterally.  CHEST:  Clear, anterior and posterior chest walls.  No rhonchi, rales, or  wheezing.  HEART:  Regular rate and rhythm with a grade 2/6 systolic ejection murmur  best heard over a pulmonic point and apical point.  ABDOMEN:  Soft, nontender.  Bowel sounds present.  RECTAL/BREAST/GENITALIA:  Not done.  Not pertinent to present illness.  EXTREMITIES:  Left hip shows range of motion with flexion of about 90  degrees, internal rotation of about 10, external rotation of 20, abduction  about 20.  She does have  some crepitus noted on passive range of motion.   IMPRESSION:  1.  End-stage arthritis of the left hip.  2.  Anxiety.  3.  Hypertension.  4.  Coronary arterial disease with normal left ventricular systolic      function.  5.  Cerebrovascular disease.  6.  Non-obstructive bilateral carotid arterial disease.  7.  Peripheral vascular disease.  8.  Hyperlipidemia.  9.  Past history of colon polyps.  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   PLAN:  The patient is admitted to [facility] to undergo left total  hip arthroplasty.  The surgery will be performed by Dr. Ollen Gross.  Her  cardiologist is Dr. Daleen Squibb, who will be notified of the room number on  admission, and will be consulted if needed for medical assistance for the  patient throughout the hospital course.       ALP/MEDQ  D:  12/28/2004  T:  12/28/2004  Job:  [id number]   cc:   Jesse Sans. Wall, M.D.   Edward L. Juanetta Gosling, M.D.  [address]  [place]  [place] [address]  Fax: [phone]

## 2010-11-18 NOTE — Consult Note (Signed)
Ruth Baker, [name] NO.:  [id number]   MEDICAL RECORD NO.:  [id number]          PATIENT TYPE:  INP   LOCATION:  1614                         FACILITY:  [facility]   PHYSICIAN:  Arvilla Meres, M.D. LHCDATE OF BIRTH:  05-07-1921   DATE OF CONSULTATION:  DATE OF DISCHARGE:                                   CONSULTATION   DATE OF CONSULTATION:  December 29, 2004.   CARDIOLOGIST:  Dr. Juanito Doom.   REQUESTING PHYSICIAN:  Dr. Lequita Baker.   PATIENT ID:  Ruth Baker is an 77 year old woman with a history of  nonobstructive coronary disease, hypertension, hyperlipidemia and peripheral  vascular disease, who we are called to consult on for postoperative chest  pain after a recent left hip replacement.   According to the records, Ruth Baker apparently had a cardiac catheterization  about 15 years ago for chest pain.  By report, there was no critical  coronary disease.  Prior to her hip replacement, she also underwent a  screening adenosine Myoview on December 22, 2004 with Dr. Daleen Squibb, which showed an  EF of 70% with no wall motion abnormalities and no evidence of reversible  ischemia.  She was admitted yesterday for an elective left hip replacement  with Dr. Lequita Baker.  Last night after surgery, she apparently took her  medications on an empty stomach, and about an hour later she began to  complain of midsternal burning and indigestion feeling, radiating to her  subscapular area.  She was given some Maalox.  She then became nauseated and  vomited twice with complete relief of her symptoms.  EKG and cardiac markers  were obtained, which showed no evidence of significant cardiac problems.   Currently she is unable to give me any significant history, as she is over-  sedated.  Her daughters are very concerned that she may be having a stroke.  However, she has received  a significant amount of morphine and recently  received 12.5 mg of Phenergan.  According to her family, she does not have  significant chest pain with activities at baseline.  They deny any  significant heart failure symptoms.   PAST MEDICAL HISTORY:  1.  Nonobstructive CAD, details unavailable.      1.  Reported cardiac catheterization 15 years ago with no critical          coronary disease.      2.  Preoperative adenosine Myoview on June 22nd, 2006 with an EF of 70%          without any wall motion abnormalities, EKG changes or ischemia.  2.  Hypertension.  3.  A history of cerebrovascular disease with nonobstructive bilateral      carotid disease.  4.  Peripheral vascular disease with questionable AAA per daughter's report.  5.  Hypertension.  6.  Hyperlipidemia.  7.  Osteoarthritis, status post left hip replacement June 28th, 2006 as per      HPI.  8.  Status post cholecystectomy.   MEDICATIONS:  1.  Currently a morphine PCA.  2.  Colace 100 mg b.i.d.  3.  Coumadin.  4.  Zetia 10 mg daily.  5.  Zocor 40 mg q.h.s.  6.  Toprol XL 50 mg daily.  7.  K-Dur 40 mEq t.i.d.  8.  Mavik 8 mg daily.  9.  Zoloft 50 mg daily.  10. Hydrochlorothiazide 25 mg daily.  11. Vancomycin 500 mg q.12h.   ALLERGIES:  She is allergic to ROCEPHIN.   SOCIAL HISTORY:  She lives alone in [place].  She is retired.  She is widowed.  She has two daughters.  She has a history of tobacco use, one pack per week  times 10 years.  She denies alcohol or drug use.   FAMILY HISTORY:  Her mother died at 66 due to old age.  Father died at 30  due to cirrhosis.  She has two sisters.  One has breast cancer, and the  other one has bone cancer.   REVIEW OF SYSTEMS:  As per HPI.  She also complains of significant left hip  pain as well as reflux symptoms.  Otherwise all systems are negative except  as listed in the HPI and the problem list.   PHYSICAL EXAMINATION:  GENERAL APPEARANCE:  She is quite sedated.  However,  she is improved significantly with 0.2 mg of Narcan.  VITAL SIGNS:  Temperature 97.8, blood pressure 121/55 with a  heart rate of  79.  Respirations are about 14.  She is saturating 91% on 2 L.  HEENT:  Sclerae are anicteric.  EOMI.  There is no xanthelasma.  NECK:  Supple.  There is no JVD.  Carotids are 2+ bilaterally with bilateral  bruits.  There is no lymphadenopathy or thyromegaly.  CARDIAC:  She has a regular rate and rhythm with no murmurs, rubs or  gallops.  LUNGS:  She has decreased breath sounds throughout bilaterally.  No  significant wheezes or crackles.  ABDOMEN:  Soft, nontender, nondistended with hypoactive bowel sounds.  There  is no hepatosplenomegaly.  There are no masses.  There is no rebound or  guarding.  EXTREMITIES:  She has a dressing on her left hip which is clean, dry and  intact.  There is no cyanosis, clubbing or edema.  She does have changes  consistent with chronic venous stasis.  NEURO STATUS:  As above, she is quite sedated but improved with Narcan.  She  is able to follow commands.  She moves all four extremities.  She can wiggle  her hands and feet on command.  She recognizes her daughter.  There is no  facial droop.  Her speech is somewhat slurred.   A chest x-ray from June 23rd shows no acute disease.   EKG from June 28th at 10 p.m. shows normal sinus rhythm with no significant  ST/T wave changes.   Labs show a hemoglobin of 12.7, hematocrit 36.9, sodium of 133, potassium  2.9, chloride 97, bicarb of 25, BUN 13, creatinine 0.7 and glucose of 137.  CK was 233 with a CK-MB of 2.8.  Troponin was 0.09.   ASSESSMENT/PLAN:  Chest pain/indigestion.  I suspect that this is likely GI  and not cardiac in nature.  I agree with the plan to continue aspirin, beta  blocker and her statin.  We will also check two more sets of cardiac  markers.  As long as her cardiac enzymes remain negative, I would not pursue any  further cardiac workup at this time.  I would also suggest continuing with IV fluids and supplementing her potassium.  We will continue to  follow with   you.       DB/MEDQ  D:  12/29/2004  T:  12/29/2004  Job:  [id number]

## 2010-11-18 NOTE — Procedures (Signed)
[facility]  Patient:    Ruth Baker, Ruth Baker Visit Number: [id number] MRN: [id number]          Service Type: OUT Location: RAD Attending Physician:  Cain Sieve Dictated by:   Vania Rea. Rinehuls, P.A. Proc. Date: 11/11/01 Admit Date:  11/11/2001   CC:         Kari Baars, M.D.   Stress Test  DATE OF BIRTH:  1920-08-13  PROCEDURE:  Adenosine Cardiolite  CARDIOLOGIST:  Valera Castle, M.D.  INDICATIONS:  Ms. Abbasi is a very pleasant 77 year old female, recently seen in the office by Dr. Daleen Squibb for evaluation of hypertension.  She has a previous history of coronary artery disease with a cardiac catheterization approximately 15 years ago, performed by Dr. Donnie Aho.  We do not have those records available, but apparently no intervention was performed.  She does have some peripheral vascular disease, and she is being referred to Dr. Veneda Melter for further evaluation.  She has a minimal tobacco history in the past.  She does have significant hypertension, which is currently under treatment.  She was scheduled for an adenosine Cardiolite to further evaluate her coronary artery disease.  Prior to the study, the patient had no complaints of chest pain or shortness of breath.  Her base electrocardiogram showed sinus rhythm, rate of 69 beats per minute, with poor R-wave progression.  DESCRIPTION OF PROCEDURE:  Adenosine was administered minutes one through four.  Cardiolite was given at three minutes.  The patient did have a flushed sensation, as well as some tightness in her abdomen, chest, and throat.  These symptoms did resolve in recovery.  There were no significant electrocardiogram changes, other than occasional PVCs and PACs.  The final images are pending at the time of this dictation. Dictated by:   Vania Rea. Rinehuls, P.A. Attending Physician:  Cain Sieve DD:  11/11/01 TD:  11/12/01 Job: [id number]

## 2010-11-18 NOTE — Assessment & Plan Note (Signed)
Ruth Baker                            CARDIOLOGY OFFICE NOTE   Ruth Baker, Ruth Baker                        MRN:          [id number]  DATE:10/23/2006                            DOB:          1920/10/12    Ruth Baker returns today for carotid Dopplers and followup of her general  vascular disease.   PROBLEM LIST:  1. Coronary artery disease with normal left ventricular systolic      function.  Last Myoview was December 22, 2004.  Ejection fraction 70%      with no ischemia.  She is asymptomatic.  2. Cerebrovascular disease:  Carotid Dopplers today demonstrate no      significant change in her carotid obstructive disease.  She has a      60-79% on the right, 0-39% on the left, with antegrade flow in both      vertebrals.  Follow up in one year.  3. Renal artery stenosis:  Her blood pressure has been under      relatively good control.  4. Hyperlipidemia:  Followed by Ruth Baker.  5. Peripheral vascular disease, which is currently asymptomatic.   MEDICATIONS:  1. HCTZ 25 mg a day.  2. Plavix 75 mg a day.  3. Toprol XL 50 mg a day.  4. Pletal 100 mg b.i.d.  5. Potassium 10 mEq daily.  6. Zoloft 50 mg a day.  7. Vytorin 10/40 daily.  8. Alprazolam 0.25 two daily.  9. Aceon 8 mg 2 daily.   PHYSICAL EXAMINATION:  VITAL SIGNS:  Her blood pressure today is 140/78.  Her pulse is 62 and regular.  Weight is 149.  HEENT:  Normocephalic and atraumatic.  Sclerae are clear.  Extraocular  movements are intact.  PERRLA.  Facial asymmetry is normal.  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  NECK:  Carotids are equal bilaterally with soft bilateral bruit.  Thyroid is not enlarged.  Trachea is midline.  LUNGS:  Clear.  HEART:  A nondisplaced PMI.  She has a normal S1 and S2.  ABDOMEN:  Soft with good bowel sounds.  EXTREMITIES:  No edema.  She has lots of varicose veins.  There is no  sign of DVT.  She  has multiple ecchymoses, particularly in the upper  extremities.  NEUROLOGIC:  Intact.   ASSESSMENT/PLAN:  Ruth Baker is stable from a vascular perspective.  I  have asked her to continue her current medications.  We will see her  back in a year.   She is cleared from a cardiac standpoint for any surgery that she needs  to have.     Ruth C. Daleen Squibb, MD, [facility]  Electronically Signed    TCW/MedQ  DD: 10/23/2006  DT: 10/23/2006  Job #: [id number]   cc:   Ruth Baker, M.D.

## 2010-11-18 NOTE — Discharge Summary (Signed)
Ruth Baker, Ruth Baker                 ACCOUNT NO.:  [id number]   MEDICAL RECORD NO.:  [id number]          PATIENT TYPE:  INP   LOCATION:  1505                         FACILITY:  [facility]   PHYSICIAN:  Ollen Gross, M.D.    DATE OF BIRTH:  01/18/1921   DATE OF ADMISSION:  12/28/2004  DATE OF DISCHARGE:  01/03/2005                                 DISCHARGE SUMMARY   ADMISSION DIAGNOSES:  1.  End-stage arthritis, left hip.  2.  Anxiety.  3.  Hypertension.  4.  Coronary arterial disease with normal left ventricular systolic      function.  5.  Cerebrovascular disease.  6.  Nonobstructive bilateral carotid arterial disease.  7.  Peripheral vascular disease.  8.  Hyperlipidemia.  9.  Past history of colon polyps.  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   DISCHARGE DIAGNOSES:  1.  Osteoarthritis, left hip, status post left total hip arthroplasty.  2.  Mild postoperative blood loss anemia.  3.  Postoperative hypokalemia, improved.  4.  Anxiety.  5.  Hypertension.  6.  Coronary arterial disease with normal left ventricular systolic      function.  7.  Cerebrovascular disease.  8.  Nonobstructive bilateral carotid arterial disease.  9.  Peripheral vascular disease.  10..  Hyperlipidemia.  1.  Past history of colon polyps.  2.  Bright's disease as an infant.   PROCEDURE:  On December 28, 2004, left total hip arthroplasty.   SURGEON:  Ollen Gross, M.D.   ASSISTANT:  Alexzandrew L. Perkins, P.A.-C.   ANESTHESIA:  Spinal.   BLOOD LOSS:  250 cc.   DRAINS:  Hemovac x1.   CONSULTS:  1.  Cardiology, Dr. Gala Romney, covering for Dr. Daleen Squibb.  2.  Rehab services.   BRIEF HISTORY:  Ms. Hagner is an 77 year old female with severe end-stage  arthritis of the left hip and intractable pain who presents now for left  total hip arthroplasty.  Preop CBC:  Hemoglobin 13.6, hematocrit 39.4, white  cell count 8.6, differential within normal limits.  Postop hemoglobin 12.7,  dropped down to 10.6 and 30.4  hematocrit.  Last noted H&H 12.1 and 37.  PT/PTT on admission 13.3 and 37, respectively.  Serial pro times followed.  Last noted PT/INR 24.3 and 2.2.  Chem panel on admission:  Elevated glucose  142.  Remaining chem panel all within normal limits.  Serial BMETs are  followed.  Potassium dropped from 3.5 down to 2.9, back up to 3.7.  Sodium  dropped from 139 down to 133.  Last noted at 132.  Cardiac enzymes were  followed, three sets.  Set #1:  CK elevated at 233.  CK-MB normal at 2.8.  Relative index normal at 1.2.  Slightly elevated troponin at 0.09.  Set #2:  CK elevated at 283.  CK-MB normal at 2.2.  Relative index normal at 0.8.  Troponin normal at 0.03.  Set #3:  CK elevated at 270.  CK-MB normal at 2.2.  Relative index normal at 0.8.  Troponin normal at 0.03.  Urinalysis, preop  negative.  Blood group type A+.  Follow-up UA:  Small hemoglobin.  Elevated  urobilinogen at 2.  Large leukocyte esterase, 11-20 white cells.   EKG:  On December 28, 2004:  Normal sinus rhythm.  Normal EKG.  Confirmed by Dr.  Jerral Bonito.   Two-view chest, no active disease, December 23, 2004.   Left hip films, two view, on December 23, 2004:  Osteoarthritis of the left hip.  Also noted, DDD, at L4-5.   Portable hip and pelvis film:  No apparent complication following a left  total hip replacement.   HOSPITAL COURSE:  Taken to OR.  Underwent above-stated procedure without  complications.  The patient tolerated the procedure well.  Left from the  recovery room to the orthopedic floor.  The patient had a rough night with  some arm and shoulder pain and some chest burning.  Known heart disease.  She felt as if she just had some heartburn and reflux-type symptoms.  EKG  was ordered and done last night, which was reviewed.  Given Mylanta for  heartburn.  Unfortunately, she developed some nausea and vomiting following  the heartburn.  Also given Phenergan, per her daughter, who was in the room.  She felt better that  morning but due to the significant history, cardiac  enzymes were ordered, and Dr. Vern Claude office was contacted for cardiac  evaluation.  Potassium dropped down to 2.9.  Started on K-Dur.  Nitro was  ordered for p.r.n. chest pain.  Also, GI cocktail for heartburn.  Patient  was seen in consultation by cardiology services.  Felt that chest pain was  more indigestion-GI nature, but the cardiac enzymes were followed out.  Did  not show any evidence of myocardial damage and felt this was more of a  gastrointestinal episode.   By day #2, she was doing much better.  More rested.  She was initially  placed on the telemetry floor postoperatively but later transferred to the  ortho floor once she was cleared.  A rehab consult was ordered.  Patient was  seen in consultation and felt that she could possibly benefit from  undergoing rehab prior to going home.  She will be transferred at the time a  bed became available.  Dressing was changed on day #2.  Incision looked  good.  Due to the fact that cardiac problems had been ruled out, therapy was  reinitiated.  She was seen again by cardiology and felt that since she had  been cleared, she would be good to go for rehab.   By day #3, she was doing well from an orthopedic standpoint.  She was slow  to progress from physical therapy, getting up, ambulating short distances.  Continued in-house through the weekend with the possibility of going to the  rehab after the weekend.  Remained receiving therapy and wound care.   By day #5, she was resting comfortably.  Slowly progressing with physical  therapy.  She was ambulating approximately 120 feet by that time.  She was  awaiting SACU placement.  She was seen in rounds later that day.  The rehab  situation was discussed with her.  Through the weekend, she had progressed  well enough with her physical therapy that rehab felt she may not require inpatient stay, but the family did want her to try to go to some  type of  facility.  FL2 was placed on the chart for signature.  It was felt if she  had appropriate help at home, the assistance with mobility could  be aided by  a hospital bed.  She was seen back in rounds on January 03, 2005.  Incision was  healing well.  Good progress with physical therapy.  Discharge planning was  helping to arrange all equipment, including a hospital bed at home.  Patient's family was in agreement. She was discharged home later that day.   DISCHARGE PLAN:  1.  Patient was discharged home on January 03, 2005.  2.  Discharge diagnoses:  Please see above.  3.  Discharge meds:  Robaxin, Vicodin, Coumadin.  4.  Diet:  Resume previous home diet.  5.  Follow up two weeks from surgery.  6.  Activity:  Partial weightbearing 25-50%, left lower extremity.  Total      hip protocol.  Hip precautions.  Home health PT/OT and home health      nursing.   DISPOSITION:  Home with family and equipment.   CONDITION ON DISCHARGE:  Improved.       ALP/MEDQ  D:  01/11/2005  T:  01/11/2005  Job:  [id number]   cc:   Thomas C. Wall, M.D.   Edward L. Juanetta Gosling, M.D.  [address]  [place]  [place] [address]  Fax: [phone]   Rehab Services

## 2010-12-05 ENCOUNTER — Encounter: Payer: Self-pay | Admitting: *Deleted

## 2010-12-05 MED ORDER — PERINDOPRIL ERBUMINE 8 MG PO TABS: 8.0000 mg | ORAL_TABLET | Freq: Every day | ORAL | Status: DC

## 2010-12-05 NOTE — Telephone Encounter (Signed)
Pt calling  Re fax to dentist re plavix, dentist doesn't have it yet and pt wants to know if we can send it asap and call her and let her know when to stop it-pt  [phone]

## 2010-12-05 NOTE — Telephone Encounter (Signed)
Pt calls to hold plavix prior to dental procedure on 12/13/10.   She will hold plavix for 5 days and letter sent to her dentist.  She is not having any cardiac issues at this time but is concerned that she may need 2 teeth extracted. Mylo Red RN

## 2010-12-12 ENCOUNTER — Encounter: Payer: Self-pay | Admitting: Cardiology

## 2010-12-21 ENCOUNTER — Encounter: Payer: Self-pay | Admitting: Cardiology

## 2010-12-21 ENCOUNTER — Ambulatory Visit (INDEPENDENT_AMBULATORY_CARE_PROVIDER_SITE_OTHER): Payer: Medicare Other | Admitting: Cardiology

## 2010-12-21 VITALS — BP 108/62 | HR 73 | Ht 64.0 in | Wt 119.0 lb

## 2010-12-21 DIAGNOSIS — I679 Cerebrovascular disease, unspecified: Secondary | ICD-10-CM

## 2010-12-21 DIAGNOSIS — E785 Hyperlipidemia, unspecified: Secondary | ICD-10-CM

## 2010-12-21 DIAGNOSIS — I739 Peripheral vascular disease, unspecified: Secondary | ICD-10-CM

## 2010-12-21 DIAGNOSIS — I2510 Atherosclerotic heart disease of native coronary artery without angina pectoris: Secondary | ICD-10-CM

## 2010-12-21 NOTE — Patient Instructions (Addendum)
**Note De-Identified Dajuana Palen Obfuscation** Your physician recommends that you continue on your current medications as directed. Please refer to the Current Medication list given to you today.  Your physician recommends that you return for lab work in: this week   Your physician recommends that you schedule a follow-up appointment in: 1 year

## 2010-12-21 NOTE — Assessment & Plan Note (Signed)
Stable, continue medical therapy. 

## 2010-12-21 NOTE — Assessment & Plan Note (Signed)
Stable. Continue medical therapy 

## 2010-12-21 NOTE — Progress Notes (Signed)
HPI Ruth Baker returns today for followup of her diffuse vascular disease. She is being treated with medical therapy only.  She has multiple complaints today protected about just getting old and useless. She has chronic right hip pain and has been denied surgery. I agree with her not being operative candidate. She also has a rectocele that is very uncomfortable. She is unable to get around very well but has very close attentive family that looks after her and checks on her several times a day.  She's having no angina. She's had no symptoms of TIAs or mini strokes. She is not active enough to get claudication in my opinion.  EKG is normal.  Past Medical History  Diagnosis Date  . Cerebrovascular disease, unspecified   . PVD (peripheral vascular disease)   . Other and unspecified hyperlipidemia     mixed  . Atherosclerosis of renal artery   . CAD (coronary artery disease)     unspecified site    Past Surgical History  Procedure Date  . Gallbladder surgery 1996  . Hysterectomy-unspecified area 1983  . Colonoscopy w/ polypectomy   . Left total hip 6/06    Family History  Problem Relation Age of Onset  . Cancer      family Hx    History   Social History  . Marital Status: Widowed    Spouse Name: N/A    Number of Children: N/A  . Years of Education: N/A   Occupational History  . Not on file.   Social History Main Topics  . Smoking status: Former Games developer  . Smokeless tobacco: Not on file  . Alcohol Use: No  . Drug Use: Not on file  . Sexually Active: Not on file   Other Topics Concern  . Not on file   Social History Narrative   Full time. Widowed.     Allergies  Allergen Reactions  . Ceftriaxone Sodium   . Morphine     Current Outpatient Prescriptions  Medication Sig Dispense Refill  . ALPRAZolam (XANAX) 0.25 MG tablet Take 0.25 mg by mouth at bedtime as needed.        Marland Kitchen aspirin 81 MG tablet Take 81 mg by mouth daily.        . cilostazol (PLETAL) 100 MG  tablet Take 1 tablet (100 mg total) by mouth 2 (two) times daily.  180 tablet  3  . clopidogrel (PLAVIX) 75 MG tablet Take 75 mg by mouth daily.        Marland Kitchen ezetimibe-simvastatin (VYTORIN) 10-40 MG per tablet Take 1 tablet by mouth at bedtime.        . furosemide (LASIX) 40 MG tablet Take 40 mg by mouth daily.        Marland Kitchen HYDROcodone-acetaminophen (VICODIN) 5-500 MG per tablet Take 1 tablet by mouth every 6 (six) hours as needed.        . metoprolol (TOPROL-XL) 50 MG 24 hr tablet Take 50 mg by mouth daily.        . perindopril (ACEON) 8 MG tablet Take 1 tablet (8 mg total) by mouth daily.  30 tablet  11  . polyethylene glycol powder (MIRALAX) powder Take 17 g by mouth as needed.        . potassium chloride SA (K-DUR,KLOR-CON) 20 MEQ tablet Take 20 mEq by mouth daily.        . sertraline (ZOLOFT) 50 MG tablet Take 50 mg by mouth daily.        Marland Kitchen DISCONTD: furosemide (  LASIX) 20 MG tablet Take 20 mg by mouth daily.          ROS Negative other than HPI.   PE General Appearance: well developed, well nourished in no acute distress, chronically ill-appearing, fragile. HEENT: symmetrical face, PERRLA, good dentition  Neck: no JVD, thyromegaly, or adenopathy, trachea midline Chest: symmetric without deformity Cardiac: PMI non-displaced, RRR, normal S1, S2, no gallop or murmur Lung: clear to ausculation and percussion Vascular: all pulses full without bruits  Abdominal: nondistended, nontender, good bowel sounds, no HSM, no bruits Extremities: no cyanosis, clubbing, Minimal pitting edema with skin discoloration and chronic changes, no sign of DVT, no varicosities  Skin: normal color, no rashes Neuro: alert and oriented x 3, non-focal Pysch: normal affect, irritable and probably depressed   Filed Vitals:   12/21/10 1535  BP: 108/62  Pulse: 73  Height: 5\' 4"  (1.626 m)  Weight: 119 lb (53.978 kg)  SpO2: 96%    EKG  Labs and Studies Reviewed.   Lab Results  Component Value Date   WBC 7.6  04/18/2010   HGB 12.9 04/18/2010   HCT 38.5 04/18/2010   MCV 94.2 04/18/2010   PLT 271 04/18/2010      Chemistry      Component Value Date/Time   NA 142 04/18/2010 1130   K 3.4* 04/18/2010 1130   CL 103 04/18/2010 1130   CO2 27 04/18/2010 1130   BUN 18 04/18/2010 1130   CREATININE 0.96 04/18/2010 1130      Component Value Date/Time   CALCIUM 9.5 04/18/2010 1130   ALKPHOS 74 04/20/2009 1850   AST 27 04/20/2009 1850   ALT 14 04/20/2009 1850   BILITOT 0.8 04/20/2009 1850       Lab Results  Component Value Date   CHOL 132 10/03/2007   Lab Results  Component Value Date   HDL 59.7 10/03/2007   Lab Results  Component Value Date   LDLCALC 56 10/03/2007   Lab Results  Component Value Date   TRIG 81 10/03/2007   Lab Results  Component Value Date   CHOLHDL 2.2 CALC 10/03/2007   No results found for this basename: HGBA1C   Lab Results  Component Value Date   ALT 14 04/20/2009   AST 27 04/20/2009   ALKPHOS 74 04/20/2009   BILITOT 0.8 04/20/2009   No results found for this basename: TSH

## 2010-12-27 ENCOUNTER — Other Ambulatory Visit: Payer: Self-pay | Admitting: Cardiology

## 2010-12-28 LAB — LIPID PANEL
HDL: 47 mg/dL (ref >39)
LDL Cholesterol: 68 mg/dL (ref 0–99)
Triglycerides: 145 mg/dL (ref <150)
VLDL: 29 mg/dL (ref 0–40)

## 2010-12-28 LAB — COMPREHENSIVE METABOLIC PANEL
AST: 20 U/L (ref 0–37)
Albumin: 4.3 g/dL (ref 3.5–5.2)
Alkaline Phosphatase: 52 U/L (ref 39–117)
BUN: 25 mg/dL — ABNORMAL HIGH (ref 6–23)
Glucose, Bld: 103 mg/dL — ABNORMAL HIGH (ref 70–99)
Potassium: 4.7 mEq/L (ref 3.5–5.3)
Sodium: 143 mEq/L (ref 135–145)
Total Bilirubin: 0.5 mg/dL (ref 0.3–1.2)

## 2011-01-13 ENCOUNTER — Telehealth: Payer: Self-pay | Admitting: *Deleted

## 2011-01-13 NOTE — Telephone Encounter (Signed)
Pt aware of lab results. Debbie Steed Kanaan RN  

## 2011-01-13 NOTE — Telephone Encounter (Signed)
Message copied by Theda Belfast on Fri Jan 13, 2011  9:11 AM ------      Message from: Valera Castle C      Created: Fri Dec 30, 2010  9:33 AM       Excellent results, no change in therapy recheck in one year

## 2011-01-13 NOTE — Telephone Encounter (Signed)
LMTCB Debbie Rayansh Herbst RN  

## 2011-01-13 NOTE — Telephone Encounter (Signed)
Message copied by Theda Belfast on Fri Jan 13, 2011  3:32 PM ------      Message from: Valera Castle C      Created: Fri Dec 30, 2010  9:33 AM       Excellent results, no change in therapy recheck in one year

## 2011-01-23 ENCOUNTER — Other Ambulatory Visit: Payer: Self-pay

## 2011-01-23 MED ORDER — PERINDOPRIL ERBUMINE 8 MG PO TABS: 8.0000 mg | ORAL_TABLET | Freq: Every day | ORAL | Status: DC

## 2011-02-06 ENCOUNTER — Other Ambulatory Visit: Payer: Self-pay

## 2011-02-06 MED ORDER — PERINDOPRIL ERBUMINE 8 MG PO TABS: 16.0000 mg | ORAL_TABLET | Freq: Every day | ORAL | Status: DC

## 2011-02-21 ENCOUNTER — Other Ambulatory Visit (HOSPITAL_COMMUNITY): Payer: Self-pay | Admitting: Pulmonary Disease

## 2011-02-21 DIAGNOSIS — N6310 Unspecified lump in the right breast, unspecified quadrant: Secondary | ICD-10-CM

## 2011-02-21 DIAGNOSIS — Z139 Encounter for screening, unspecified: Secondary | ICD-10-CM

## 2011-03-08 ENCOUNTER — Ambulatory Visit (HOSPITAL_COMMUNITY): Payer: Medicare Other

## 2011-03-15 ENCOUNTER — Other Ambulatory Visit (HOSPITAL_COMMUNITY): Payer: Self-pay | Admitting: Pulmonary Disease

## 2011-03-15 ENCOUNTER — Ambulatory Visit (HOSPITAL_COMMUNITY)
Admission: RE | Admit: 2011-03-15 | Discharge: 2011-03-15 | Disposition: A | Discharge status: Home / Self Care | Payer: Medicare Other | Source: Ambulatory Visit | Attending: Pulmonary Disease | Admitting: Pulmonary Disease

## 2011-03-15 DIAGNOSIS — N6310 Unspecified lump in the right breast, unspecified quadrant: Secondary | ICD-10-CM

## 2011-03-15 DIAGNOSIS — N63 Unspecified lump in unspecified breast: Secondary | ICD-10-CM | POA: Insufficient documentation

## 2011-03-22 ENCOUNTER — Other Ambulatory Visit (HOSPITAL_COMMUNITY): Payer: Self-pay | Admitting: Pulmonary Disease

## 2011-03-22 ENCOUNTER — Other Ambulatory Visit: Payer: Self-pay | Admitting: Radiology

## 2011-03-22 ENCOUNTER — Ambulatory Visit (HOSPITAL_COMMUNITY)
Admission: RE | Admit: 2011-03-22 | Discharge: 2011-03-22 | Disposition: A | Discharge status: Home / Self Care | Payer: Medicare Other | Source: Ambulatory Visit | Attending: Pulmonary Disease | Admitting: Pulmonary Disease

## 2011-03-22 ENCOUNTER — Inpatient Hospital Stay (HOSPITAL_COMMUNITY): Admission: RE | Admit: 2011-03-22 | Discharge: 2011-03-22 | Payer: Medicare Other | Source: Ambulatory Visit

## 2011-03-22 DIAGNOSIS — N6310 Unspecified lump in the right breast, unspecified quadrant: Secondary | ICD-10-CM

## 2011-03-22 DIAGNOSIS — C50919 Malignant neoplasm of unspecified site of unspecified female breast: Secondary | ICD-10-CM | POA: Insufficient documentation

## 2011-03-22 NOTE — Procedures (Signed)
preop diagnosis:  [facility] Postop diagnosis: [facility] Procedure:  BREAST BX Radiologist:  Jean Rosenthal  Specimen:  3 CORES Complications: W/O EBL:   W/O

## 2011-03-22 NOTE — OR Nursing (Signed)
Procedure began at 1244. Xylocaine 2% , 8 cc injected by Dr.Jackson.  Pressure bandage applied by Johnnye Lana, Rad tech.  Times 10 minProcedure ended at 12:53. Post procedure vitals 150/73, 67, 96 percent . Patient tolerated procedure well.

## 2011-04-21 ENCOUNTER — Other Ambulatory Visit: Payer: Self-pay

## 2011-04-21 ENCOUNTER — Telehealth: Payer: Self-pay | Admitting: Cardiology

## 2011-04-21 MED ORDER — CLOPIDOGREL BISULFATE 75 MG PO TABS: 75.0000 mg | ORAL_TABLET | Freq: Every day | ORAL | Status: DC

## 2011-04-21 NOTE — Telephone Encounter (Signed)
Patient needs RX for Plavix faxed to [facility]. / tg

## 2011-07-24 ENCOUNTER — Telehealth: Payer: Self-pay | Admitting: Cardiology

## 2011-07-24 NOTE — Telephone Encounter (Signed)
**Note De-identified Monte Bronder Obfuscation** Please advise./LV 

## 2011-07-24 NOTE — Telephone Encounter (Signed)
Patient is scheduled for dental procedure, tooth is broken.  Will she need to be taken off her Plavix?  Appointment is on 08/03/2011.  Please call patient.  Dentist is Dr. [phone].  Speak loud for patient, is hard of hearing or ask for daughter Inocencio Homes.

## 2011-07-26 ENCOUNTER — Telehealth: Payer: Self-pay | Admitting: *Deleted

## 2011-07-26 NOTE — Telephone Encounter (Signed)
Have her stop Plavix for 5 days, prior to procedure and start back ASAP.  Joni Reining NP

## 2011-07-26 NOTE — Telephone Encounter (Signed)
Advised patient of instructions

## 2011-08-16 ENCOUNTER — Other Ambulatory Visit: Payer: Self-pay | Admitting: Cardiology

## 2011-08-16 MED ORDER — PERINDOPRIL ERBUMINE 8 MG PO TABS: 16.0000 mg | ORAL_TABLET | Freq: Every day | ORAL | Status: DC

## 2011-08-16 MED ORDER — METOPROLOL SUCCINATE ER 50 MG PO TB24: 50.0000 mg | ORAL_TABLET | Freq: Every day | ORAL | Status: DC

## 2011-08-16 MED ORDER — CLOPIDOGREL BISULFATE 75 MG PO TABS: 75.0000 mg | ORAL_TABLET | Freq: Every day | ORAL | Status: DC

## 2011-08-16 MED ORDER — EZETIMIBE-SIMVASTATIN 10-40 MG PO TABS: 1.0000 | ORAL_TABLET | Freq: Every day | ORAL | Status: DC

## 2011-08-16 MED ORDER — CILOSTAZOL 100 MG PO TABS: 100.0000 mg | ORAL_TABLET | Freq: Two times a day (BID) | ORAL | Status: DC

## 2011-08-16 NOTE — Telephone Encounter (Signed)
[facility] [phone]. THEY ARE GOING TO DO LOCAL PHARMACY NOT MAIL ORDER SO PLEASE CALL IN FOR A MONTH AT A TIME.

## 2011-08-18 ENCOUNTER — Other Ambulatory Visit: Payer: Self-pay | Admitting: *Deleted

## 2011-08-18 MED ORDER — EZETIMIBE-SIMVASTATIN 10-40 MG PO TABS: 1.0000 | ORAL_TABLET | Freq: Every day | ORAL | Status: DC

## 2011-08-18 MED ORDER — METOPROLOL SUCCINATE ER 50 MG PO TB24: 50.0000 mg | ORAL_TABLET | Freq: Every day | ORAL | Status: DC

## 2011-08-18 MED ORDER — CLOPIDOGREL BISULFATE 75 MG PO TABS: 75.0000 mg | ORAL_TABLET | Freq: Every day | ORAL | Status: DC

## 2011-08-18 MED ORDER — CILOSTAZOL 100 MG PO TABS: 100.0000 mg | ORAL_TABLET | Freq: Two times a day (BID) | ORAL | Status: DC

## 2011-08-18 MED ORDER — PERINDOPRIL ERBUMINE 8 MG PO TABS: 16.0000 mg | ORAL_TABLET | Freq: Every day | ORAL | Status: DC

## 2011-08-21 ENCOUNTER — Emergency Department (HOSPITAL_COMMUNITY)
Admission: EM | Admit: 2011-08-21 | Discharge: 2011-08-21 | Disposition: A | Discharge status: Home / Self Care | Payer: Medicare Other | Attending: Emergency Medicine | Admitting: Emergency Medicine

## 2011-08-21 ENCOUNTER — Emergency Department (HOSPITAL_COMMUNITY): Payer: Medicare Other

## 2011-08-21 ENCOUNTER — Encounter (HOSPITAL_COMMUNITY): Payer: Self-pay

## 2011-08-21 DIAGNOSIS — Z79899 Other long term (current) drug therapy: Secondary | ICD-10-CM | POA: Insufficient documentation

## 2011-08-21 DIAGNOSIS — L819 Disorder of pigmentation, unspecified: Secondary | ICD-10-CM | POA: Insufficient documentation

## 2011-08-21 DIAGNOSIS — K623 Rectal prolapse: Secondary | ICD-10-CM | POA: Insufficient documentation

## 2011-08-21 DIAGNOSIS — K6289 Other specified diseases of anus and rectum: Secondary | ICD-10-CM | POA: Insufficient documentation

## 2011-08-21 DIAGNOSIS — I2510 Atherosclerotic heart disease of native coronary artery without angina pectoris: Secondary | ICD-10-CM | POA: Insufficient documentation

## 2011-08-21 DIAGNOSIS — Z8673 Personal history of transient ischemic attack (TIA), and cerebral infarction without residual deficits: Secondary | ICD-10-CM | POA: Insufficient documentation

## 2011-08-21 DIAGNOSIS — Z7982 Long term (current) use of aspirin: Secondary | ICD-10-CM | POA: Insufficient documentation

## 2011-08-21 DIAGNOSIS — I739 Peripheral vascular disease, unspecified: Secondary | ICD-10-CM | POA: Insufficient documentation

## 2011-08-21 HISTORY — DX: Cerebral infarction, unspecified: I63.9

## 2011-08-21 NOTE — ED Provider Notes (Signed)
This chart was scribed for Carleene Cooper III, MD by Wallis Mart. The patient was seen in room APA05/APA05 and the patient's care was started at 3:50 PM.   CSN: [id number]  Arrival date & time 08/21/11  1410   First MD Initiated Contact with Patient 08/21/11 1544      Chief Complaint  Patient presents with  . Rectal Pain    (Consider location/radiation/quality/duration/timing/severity/associated sxs/prior treatment) HPI Ruth Baker is a 78 y.o. female who presents to the Emergency Department complaining of sudden onset, persistence of constant,  moderate rectal pain onset today. Pt describes pain as throbbing, pain radiates nowhere. Pt w/ h/o rectal prolapse that happens every time she strains.   Per daughter pt couldn't talk this morning, was shaking, left hand was numb. There are no other associated symptoms and no other alleviating or aggravating factors.   Pt had apt w/ PCP Dr. Juanetta Gosling at 2 pm today, but didn't go.    Pt w/ h/o weak heart, hip that needs replaced, inoperable breast cancer (right breast), stroke 2 years ago.      Past Medical History  Diagnosis Date  . Cerebrovascular disease, unspecified   . PVD (peripheral vascular disease)   . Other and unspecified hyperlipidemia     mixed  . Atherosclerosis of renal artery   . CAD (coronary artery disease)     unspecified site  . Stroke     Past Surgical History  Procedure Date  . Gallbladder surgery 1996  . Hysterectomy-unspecified area 1983  . Colonoscopy w/ polypectomy   . Left total hip 6/06    Family History  Problem Relation Age of Onset  . Cancer      family Hx    History  Substance Use Topics  . Smoking status: Former Games developer  . Smokeless tobacco: Not on file  . Alcohol Use: No    OB History    Grav Para Term Preterm Abortions TAB SAB Ect Mult Living                  Review of Systems  10 Systems reviewed and are negative for acute change except as noted in the HPI.  Allergies    Ceftriaxone sodium and Morphine  Home Medications   Current Outpatient Rx  Name Route Sig Dispense Refill  . ALPRAZOLAM 0.25 MG PO TABS Oral Take 0.25 mg by mouth at bedtime as needed.      . ASPIRIN 81 MG PO TABS Oral Take 81 mg by mouth daily.      Marland Kitchen CILOSTAZOL 100 MG PO TABS Oral Take 1 tablet (100 mg total) by mouth 2 (two) times daily. 60 tablet 12  . CLOPIDOGREL BISULFATE 75 MG PO TABS Oral Take 1 tablet (75 mg total) by mouth daily. 30 tablet 12  . EZETIMIBE-SIMVASTATIN 10-40 MG PO TABS Oral Take 1 tablet by mouth at bedtime. 30 tablet 12  . FUROSEMIDE 40 MG PO TABS Oral Take 40 mg by mouth daily.      Marland Kitchen HYDROCODONE-ACETAMINOPHEN 5-500 MG PO TABS Oral Take 1 tablet by mouth every 6 (six) hours as needed.      Marland Kitchen METOPROLOL SUCCINATE ER 50 MG PO TB24 Oral Take 1 tablet (50 mg total) by mouth daily. 30 tablet 12  . PERINDOPRIL ERBUMINE 8 MG PO TABS Oral Take 2 tablets (16 mg total) by mouth daily. 60 tablet 12    Increase in dose  . POLYETHYLENE GLYCOL 3350 PO POWD Oral Take 17 g by  mouth as needed.      Marland Kitchen POTASSIUM CHLORIDE CRYS ER 20 MEQ PO TBCR Oral Take 20 mEq by mouth daily.      . SERTRALINE HCL 50 MG PO TABS Oral Take 50 mg by mouth daily.        BP 152/98  Pulse 73  Temp(Src) 97.5 F (36.4 C) (Oral)  Resp 20  Ht 5\' 4"  (1.626 m)  Wt 120 lb (54.432 kg)  BMI 20.60 kg/m2  SpO2 97%  Physical Exam  Nursing note and vitals reviewed. Constitutional: She is oriented to person, place, and time. She appears well-developed and well-nourished. No distress.  HENT:  Head: Normocephalic and atraumatic.  Eyes: EOM are normal.       Pupils constricted from drops for glaucoma  Neck: Normal range of motion. Neck supple. No tracheal deviation present.  Cardiovascular: Normal rate, regular rhythm and normal heart sounds.   Pulmonary/Chest: Effort normal and breath sounds normal. No respiratory distress.  Abdominal: Soft. Bowel sounds are normal. She exhibits no distension.   Genitourinary:       Rectal prolapse protruding 2 cm out  Musculoskeletal: Normal range of motion. She exhibits no edema.       hyperpigmentation of legs  Neurological: She is alert and oriented to person, place, and time. No sensory deficit.  Skin: Skin is warm and dry.  Psychiatric: She has a normal mood and affect. Her behavior is normal.    ED Course  Procedures (including critical care time) DIAGNOSTIC STUDIES: Oxygen Saturation is 97% on room air, normal by my interpretation.    COORDINATION OF CARE:  4:02 PM: Rectal prolapse reversed  4:06: EDP on phone with Dr. Juanetta Gosling, pt's PCP  4:16 PM: EDP at bedside, discussing options w/ family, family does not want any labs or tests run, explored the idea of nursing home placement but pt does not want that  Labs Reviewed - No data to display No results found.   1. Rectal prolapse      I personally performed the services described in this documentation, which was scribed in my presence. The recorded information has been reviewed and considered.  Osvaldo Human, M.D.   Carleene Cooper III, MD 08/21/11 [phone]

## 2011-08-21 NOTE — ED Notes (Signed)
Pt didn't want any of the test done/ Dr Ignacia Palma

## 2011-08-21 NOTE — ED Notes (Signed)
Patient requests to go home, does not want any testing done

## 2011-08-21 NOTE — ED Notes (Signed)
ems reports pt has history of rectal prolapse.  Says happens every time she strains.  C/O throbbing pain.

## 2011-12-25 ENCOUNTER — Ambulatory Visit: Payer: 59 | Admitting: Cardiology

## 2011-12-25 ENCOUNTER — Encounter: Payer: Self-pay | Admitting: Cardiology

## 2011-12-25 ENCOUNTER — Ambulatory Visit (INDEPENDENT_AMBULATORY_CARE_PROVIDER_SITE_OTHER): Payer: Medicare Other | Admitting: Cardiology

## 2011-12-25 VITALS — BP 117/67 | HR 77 | Resp 16 | Ht 65.0 in | Wt 122.0 lb

## 2011-12-25 DIAGNOSIS — I2510 Atherosclerotic heart disease of native coronary artery without angina pectoris: Secondary | ICD-10-CM

## 2011-12-25 DIAGNOSIS — I679 Cerebrovascular disease, unspecified: Secondary | ICD-10-CM

## 2011-12-25 DIAGNOSIS — I701 Atherosclerosis of renal artery: Secondary | ICD-10-CM

## 2011-12-25 DIAGNOSIS — I739 Peripheral vascular disease, unspecified: Secondary | ICD-10-CM

## 2011-12-25 NOTE — Progress Notes (Signed)
HPI Ruth Baker comes in today for evaluation and management of her complex vascular history.  She's pretty much immobilized now with right hip pain. She sits most today. Her daughter comes with her and tells me that she was diagnosed with breast cancer with lymphatic spread. She was felt to be inoperable because of her severe comorbidities and age.  She still has a good appetite. She denies any chest pain, palpitations, orthopnea, PND or increased edema.  Past Medical History  Diagnosis Date  . Cerebrovascular disease, unspecified   . PVD (peripheral vascular disease)   . Other and unspecified hyperlipidemia     mixed  . Atherosclerosis of renal artery   . CAD (coronary artery disease)     unspecified site  . Stroke     Current Outpatient Prescriptions  Medication Sig Dispense Refill  . ALPRAZolam (XANAX) 0.5 MG tablet Take 0.25 mg by mouth daily as needed. For nerves      . aspirin EC 81 MG tablet Take 81 mg by mouth daily.      . cilostazol (PLETAL) 100 MG tablet Take 1 tablet (100 mg total) by mouth 2 (two) times daily.  60 tablet  12  . clopidogrel (PLAVIX) 75 MG tablet Take 1 tablet (75 mg total) by mouth daily.  30 tablet  12  . ezetimibe-simvastatin (VYTORIN) 10-40 MG per tablet Take 1 tablet by mouth at bedtime.  30 tablet  12  . furosemide (LASIX) 40 MG tablet Take 40 mg by mouth daily.        Marland Kitchen HYDROcodone-acetaminophen (NORCO) 10-325 MG per tablet Take 1 tablet by mouth 4 (four) times daily as needed. For pain      . metoprolol succinate (TOPROL-XL) 50 MG 24 hr tablet Take 1 tablet (50 mg total) by mouth daily.  30 tablet  12  . perindopril (ACEON) 8 MG tablet Take 8 mg by mouth 2 (two) times daily.      . polyethylene glycol powder (MIRALAX) powder Take 17 g by mouth every morning.       . potassium chloride SA (K-DUR,KLOR-CON) 20 MEQ tablet Take 20 mEq by mouth daily.        . sertraline (ZOLOFT) 50 MG tablet Take 50 mg by mouth daily.          Allergies  Allergen  Reactions  . Ceftriaxone Sodium   . Morphine     Family History  Problem Relation Age of Onset  . Cancer      family Hx    History   Social History  . Marital Status: Widowed    Spouse Name: N/A    Number of Children: N/A  . Years of Education: N/A   Occupational History  . Not on file.   Social History Main Topics  . Smoking status: Former Games developer  . Smokeless tobacco: Not on file  . Alcohol Use: No  . Drug Use: Not on file  . Sexually Active: Not on file   Other Topics Concern  . Not on file   Social History Narrative   Full time. Widowed.     ROS ALL NEGATIVE EXCEPT THOSE NOTED IN HPI  PE  General Appearance: well developed, well nourished in no acute distress, frail HEENT: symmetrical face, PERRLA, Neck: no JVD, thyromegaly, or adenopathy, trachea midline Chest: symmetric without deformity Cardiac: PMI non-displaced, RRR, normal S1, S2, no gallop, soft systolic murmur Lung: clear to ausculation and percussion Vascular: Pulses in her feet barely palpable Abdominal: nondistended, nontender,  good bowel sounds, no HSM, no bruits Extremities: no cyanosis, chronic edematous changes with profound discoloration of her knees bilaterally, no sign of DVT, no varicosities  Skin: normal color, no rashes Neuro: alert and oriented x 3, non-focal Pysch: Depressed  EKG  BMET    Component Value Date/Time   NA 143 12/27/2010 1100   K 4.7 12/27/2010 1100   CL 104 12/27/2010 1100   CO2 27 12/27/2010 1100   GLUCOSE 103* 12/27/2010 1100   BUN 25* 12/27/2010 1100   CREATININE 0.95 12/27/2010 1100   CREATININE 0.96 04/18/2010 1130   CALCIUM 9.3 12/27/2010 1100   GFRNONAA 55* 04/18/2010 1130   GFRAA  Value: >60        The eGFR has been calculated using the MDRD equation. This calculation has not been validated in all clinical situations. eGFR's persistently <60 mL/min signify possible Chronic Kidney Disease. 04/18/2010 1130    Lipid Panel     Component Value Date/Time    CHOL 144 12/27/2010 1100   TRIG 145 12/27/2010 1100   HDL 47 12/27/2010 1100   CHOLHDL 3.1 12/27/2010 1100   VLDL 29 12/27/2010 1100   LDLCALC 68 12/27/2010 1100    CBC    Component Value Date/Time   WBC 7.6 04/18/2010 1130   RBC 4.09 04/18/2010 1130   HGB 12.9 04/18/2010 1130   HCT 38.5 04/18/2010 1130   PLT 271 04/18/2010 1130   MCV 94.2 04/18/2010 1130   MCH 31.6 04/18/2010 1130   MCHC 33.6 04/18/2010 1130   RDW 15.5 04/18/2010 1130   LYMPHSABS 2.1 04/20/2009 1850   MONOABS 0.5 04/20/2009 1850   EOSABS 0.1 04/20/2009 1850   BASOSABS 0.0 04/20/2009 1850

## 2011-12-25 NOTE — Assessment & Plan Note (Signed)
Stable. Continue conservative management 

## 2011-12-25 NOTE — Patient Instructions (Addendum)
**Note De-identified Jayna Mulnix Obfuscation** Your physician recommends that you continue on your current medications as directed. Please refer to the Current Medication list given to you today.  Your physician recommends that you schedule a follow-up appointment in: 1 year  

## 2011-12-25 NOTE — Assessment & Plan Note (Signed)
Stable. No change in current medical therapy. She is a candidate only for conservative management.

## 2012-02-12 ENCOUNTER — Emergency Department (HOSPITAL_COMMUNITY): Payer: Medicare Other

## 2012-02-12 ENCOUNTER — Encounter (HOSPITAL_COMMUNITY): Payer: Self-pay | Admitting: *Deleted

## 2012-02-12 ENCOUNTER — Emergency Department (HOSPITAL_COMMUNITY)
Admission: EM | Admit: 2012-02-12 | Discharge: 2012-02-12 | Disposition: A | Discharge status: Home / Self Care | Payer: Medicare Other | Attending: Emergency Medicine | Admitting: Emergency Medicine

## 2012-02-12 DIAGNOSIS — Z8673 Personal history of transient ischemic attack (TIA), and cerebral infarction without residual deficits: Secondary | ICD-10-CM | POA: Insufficient documentation

## 2012-02-12 DIAGNOSIS — M25559 Pain in unspecified hip: Secondary | ICD-10-CM

## 2012-02-12 DIAGNOSIS — G8929 Other chronic pain: Secondary | ICD-10-CM | POA: Insufficient documentation

## 2012-02-12 DIAGNOSIS — E785 Hyperlipidemia, unspecified: Secondary | ICD-10-CM | POA: Insufficient documentation

## 2012-02-12 DIAGNOSIS — M129 Arthropathy, unspecified: Secondary | ICD-10-CM | POA: Insufficient documentation

## 2012-02-12 DIAGNOSIS — I2510 Atherosclerotic heart disease of native coronary artery without angina pectoris: Secondary | ICD-10-CM | POA: Insufficient documentation

## 2012-02-12 DIAGNOSIS — Z87891 Personal history of nicotine dependence: Secondary | ICD-10-CM | POA: Insufficient documentation

## 2012-02-12 DIAGNOSIS — Z853 Personal history of malignant neoplasm of breast: Secondary | ICD-10-CM | POA: Insufficient documentation

## 2012-02-12 HISTORY — DX: Unspecified osteoarthritis, unspecified site: M19.90

## 2012-02-12 HISTORY — DX: Other specified congenital malformations of intestine: Q43.8

## 2012-02-12 HISTORY — DX: Malignant (primary) neoplasm, unspecified: C80.1

## 2012-02-12 LAB — CBC WITH DIFFERENTIAL/PLATELET
Basophils Absolute: 0.1 10*3/uL (ref 0.0–0.1)
Eosinophils Relative: 3 % (ref 0–5)
HCT: 43.7 % (ref 36.0–46.0)
Lymphocytes Relative: 25 % (ref 12–46)
Lymphs Abs: 2.1 10*3/uL (ref 0.7–4.0)
MCV: 94.8 fL (ref 78.0–100.0)
Neutro Abs: 5.4 10*3/uL (ref 1.7–7.7)
Platelets: 271 10*3/uL (ref 150–400)
RBC: 4.61 MIL/uL (ref 3.87–5.11)
RDW: 14.8 % (ref 11.5–15.5)
WBC: 8.4 10*3/uL (ref 4.0–10.5)

## 2012-02-12 LAB — BASIC METABOLIC PANEL
BUN: 19 mg/dL (ref 6–23)
GFR calc Af Amer: 65 mL/min — ABNORMAL LOW (ref >90)
GFR calc non Af Amer: 56 mL/min — ABNORMAL LOW (ref >90)
Potassium: 3.4 mEq/L — ABNORMAL LOW (ref 3.5–5.1)

## 2012-02-12 LAB — URINALYSIS, ROUTINE W REFLEX MICROSCOPIC
Hgb urine dipstick: NEGATIVE
Protein, ur: NEGATIVE mg/dL
Specific Gravity, Urine: 1.015 (ref 1.005–1.030)

## 2012-02-12 NOTE — ED Notes (Signed)
Reports generalized weakness and pain for " a while."  Pt states worsening pain in right hip and has been unable to walk due to pain.

## 2012-02-12 NOTE — ED Provider Notes (Signed)
History  This chart was scribed for Ruth Jakes, MD by Erskine Emery. This patient was seen in room APA19/APA19 and the patient's care was started at 16:56.   CSN: [id number]  Arrival date & time 02/12/12  1648   First MD Initiated Contact with Patient 02/12/12 1656      Chief Complaint  Patient presents with  . Weakness  . Hip Pain    (Consider location/radiation/quality/duration/timing/severity/associated sxs/prior treatment) HPI Ruth Baker is a 78 y.o. female brought in by ambulance (called by the pt's daughter), who presents to the Emergency Department complaining of generalized weakness, right leg and hip pain, and some knots in her breasts. Pt reports she has had leg and hip complications bilaterally for years, but today she has not been able to put her right leg on the ground to walk due to the severity of pain. Pt reports she fell backwards from standing onto a TV set last week but has had no other injury. Pt also reports she was recently diagnosed with breast cancer but agreed with her doctor not to receive treatment for it at her age. Pt also claims she had a shoulder surgery in the right shoulder (done by Dr. Antony Odea). Now, she is experiencing some burning pain and soreness in that right upper breast area. Pt reports she also has a h/o rectum prolapse that aggravates her when she has a bowel movement, but at no other times. Pt admits she has many little issues but many of them can't be taken care of because she can't be anesthetized. Pt reports she lives by herself. Dr. Juanetta Gosling is the pt's PCP, who recommended she come into the ED and said he would come in to see her here.   Past Medical History  Diagnosis Date  . Cerebrovascular disease, unspecified   . PVD (peripheral vascular disease)   . Other and unspecified hyperlipidemia     mixed  . Atherosclerosis of renal artery   . CAD (coronary artery disease)     unspecified site  . Stroke   . Arthritis   . Congenital  prolapsed rectum   . Cancer     right breast    Past Surgical History  Procedure Date  . Gallbladder surgery 1996  . Hysterectomy-unspecified area 1983  . Colonoscopy w/ polypectomy   . Left total hip 6/06    Family History  Problem Relation Age of Onset  . Cancer      family Hx    History  Substance Use Topics  . Smoking status: Former Games developer  . Smokeless tobacco: Not on file  . Alcohol Use: No    OB History    Grav Para Term Preterm Abortions TAB SAB Ect Mult Living                  Review of Systems  Constitutional: Negative for fatigue.  HENT: Positive for hearing loss. Negative for congestion and sinus pressure.   Eyes: Negative for discharge.  Respiratory: Negative for cough.   Cardiovascular: Positive for chest pain (associated with breast issues).  Gastrointestinal: Negative for abdominal pain and diarrhea.  Genitourinary: Negative for frequency and hematuria.  Musculoskeletal: Negative for back pain.       Right leg and hip pain  Skin: Negative for rash.  Neurological: Positive for weakness. Negative for seizures and headaches.  Hematological: Negative.   Psychiatric/Behavioral: Negative for hallucinations.  All other systems reviewed and are negative.    Allergies  Morphine; Rocephin; and Tape  Home Medications   Current Outpatient Rx  Name Route Sig Dispense Refill  . ALPRAZOLAM 0.5 MG PO TABS Oral Take 0.25-0.5 mg by mouth 2 (two) times daily. For nerves. *Takes one-half to one tablet during the day and takes one whole tablet at bedtime**    . ASPIRIN EC 81 MG PO TBEC Oral Take 81 mg by mouth every morning.     Marland Kitchen CALCIUM 500 PO Oral Take 1 tablet by mouth every morning.    Marland Kitchen CILOSTAZOL 100 MG PO TABS Oral Take 1 tablet (100 mg total) by mouth 2 (two) times daily. 60 tablet 12  . CLOPIDOGREL BISULFATE 75 MG PO TABS Oral Take 75 mg by mouth every morning.    Marland Kitchen EZETIMIBE-SIMVASTATIN 10-40 MG PO TABS Oral Take 1 tablet by mouth at bedtime. 30  tablet 12  . FUROSEMIDE 40 MG PO TABS Oral Take 40 mg by mouth every morning.     Marland Kitchen HYDROCODONE-ACETAMINOPHEN 10-325 MG PO TABS Oral Take 1 tablet by mouth 4 (four) times daily as needed. For pain    . METOPROLOL SUCCINATE ER 50 MG PO TB24 Oral Take 50 mg by mouth every morning.    . MULTIVITAMIN GUMMIES ADULT PO CHEW Oral Chew 2 each by mouth every morning.    Marland Kitchen PERINDOPRIL ERBUMINE 8 MG PO TABS Oral Take 8 mg by mouth 2 (two) times daily.    Marland Kitchen POLYETHYLENE GLYCOL 3350 PO POWD Oral Take 17 g by mouth every morning.     Marland Kitchen POTASSIUM CHLORIDE CRYS ER 20 MEQ PO TBCR Oral Take 20 mEq by mouth every morning.     Marland Kitchen SERTRALINE HCL 50 MG PO TABS Oral Take 50 mg by mouth every morning.     Marland Kitchen VITAMIN E PO Oral Take 1 tablet by mouth every morning.      Triage Vitals: BP 174/65  Pulse 62  Temp 98.8 F (37.1 C) (Oral)  Resp 18  Ht 5' 4.5" (1.638 m)  Wt 120 lb (54.432 kg)  BMI 20.28 kg/m2  SpO2 95%  Physical Exam  Nursing note and vitals reviewed. Constitutional: She is oriented to person, place, and time. She appears well-nourished. No distress.  HENT:  Head: Normocephalic and atraumatic.  Eyes: EOM are normal. Pupils are equal, round, and reactive to light.  Neck: Neck supple. No tracheal deviation present.  Cardiovascular: Normal rate and normal heart sounds.   No murmur heard. Pulmonary/Chest: Effort normal and breath sounds normal. No respiratory distress.       6 cm area on right upper chest that is hard.   Abdominal: Soft. Bowel sounds are normal. She exhibits no distension.  Musculoskeletal: Normal range of motion. She exhibits no edema.       Left ankle soreness. Knees are not swollen.  Neurological: She is alert and oriented to person, place, and time.  Skin: Skin is warm and dry.  Psychiatric: She has a normal mood and affect.    ED Course  Procedures (including critical care time) DIAGNOSTIC STUDIES: Oxygen Saturation is 95% on room air, adequate by my interpretation.     COORDINATION OF CARE: 17:20--I evaluated the patient and told her I would consult with Dr. Juanetta Gosling.   20:23--I rechecked the pt and notified her and her daughter that her labs look fantastic. I informed them that she has no hip fracture but does have arthritis in that right hip. I told her that we are still waiting on the urinalysis before we can discharge her.  21:24--I rechecked the patient again.   Labs Reviewed  BASIC METABOLIC PANEL - Abnormal; Notable for the following:    Potassium 3.4 (*)     GFR calc non Af Amer 56 (*)     GFR calc Af Amer 65 (*)     All other components within normal limits  CBC WITH DIFFERENTIAL  URINALYSIS, ROUTINE W REFLEX MICROSCOPIC   Dg Chest 2 View  02/12/2012  *RADIOLOGY REPORT*  Clinical Data: Weakness.  Breast cancer  CHEST - 2 VIEW  Comparison: 12/23/2004  Findings: Heart size is upper normal.  Negative for heart failure. Negative for pneumonia or mass.  Negative for pleural effusion. Lungs are hyperinflated with changes of COPD.  Right shoulder replacement.  IMPRESSION: No acute cardiopulmonary disease.  Original Report Authenticated By: Camelia Phenes, M.D.   Dg Hip Bilateral Vito Berger  02/12/2012  *RADIOLOGY REPORT*  Clinical Data: Left-sided hip replacement, weakness  BILATERAL HIP WITH PELVIS - 4+ VIEW  Comparison: None.  Findings: No acute fracture is seen.  A left hip replacement is noted and the femoral and acetabular components appear to be in good position.  The bones are osteopenic.  There is significant degenerative joint disease involving the right hip with loss of joint space, sclerosis, and spurring.  Degenerative change also is noted in the lower lumbar spine.  The pelvic rami appear intact.  IMPRESSION:  1.  No acute fracture. 2.  Left total hip replacement. 3. Significant degenerative joint disease of the right hip.  Original Report Authenticated By: Juline Patch, M.D.   Results for orders placed during the hospital encounter of  02/12/12  CBC WITH DIFFERENTIAL      Component Value Range   WBC 8.4  4.0 - 10.5 K/uL   RBC 4.61  3.87 - 5.11 MIL/uL   Hemoglobin 14.2  12.0 - 15.0 g/dL   HCT 16.1  09.6 - 04.5 %   MCV 94.8  78.0 - 100.0 fL   MCH 30.8  26.0 - 34.0 pg   MCHC 32.5  30.0 - 36.0 g/dL   RDW 40.9  81.1 - 91.4 %   Platelets 271  150 - 400 K/uL   Neutrophils Relative 64  43 - 77 %   Neutro Abs 5.4  1.7 - 7.7 K/uL   Lymphocytes Relative 25  12 - 46 %   Lymphs Abs 2.1  0.7 - 4.0 K/uL   Monocytes Relative 8  3 - 12 %   Monocytes Absolute 0.6  0.1 - 1.0 K/uL   Eosinophils Relative 3  0 - 5 %   Eosinophils Absolute 0.2  0.0 - 0.7 K/uL   Basophils Relative 1  0 - 1 %   Basophils Absolute 0.1  0.0 - 0.1 K/uL  BASIC METABOLIC PANEL      Component Value Range   Sodium 143  135 - 145 mEq/L   Potassium 3.4 (*) 3.5 - 5.1 mEq/L   Chloride 105  96 - 112 mEq/L   CO2 31  19 - 32 mEq/L   Glucose, Bld 95  70 - 99 mg/dL   BUN 19  6 - 23 mg/dL   Creatinine, Ser 7.82  0.50 - 1.10 mg/dL   Calcium 9.8  8.4 - 95.6 mg/dL   GFR calc non Af Amer 56 (*) >90 mL/min   GFR calc Af Amer 65 (*) >90 mL/min  URINALYSIS, ROUTINE W REFLEX MICROSCOPIC      Component Value Range   Color, Urine YELLOW  YELLOW   APPearance CLEAR  CLEAR   Specific Gravity, Urine 1.015  1.005 - 1.030   pH 6.0  5.0 - 8.0   Glucose, UA NEGATIVE  NEGATIVE mg/dL   Hgb urine dipstick NEGATIVE  NEGATIVE   Bilirubin Urine NEGATIVE  NEGATIVE   Ketones, ur NEGATIVE  NEGATIVE mg/dL   Protein, ur NEGATIVE  NEGATIVE mg/dL   Urobilinogen, UA 0.2  0.0 - 1.0 mg/dL   Nitrite NEGATIVE  NEGATIVE   Leukocytes, UA NEGATIVE  NEGATIVE     1. Hip pain, chronic       MDM  Patient with long-term right hip pain and right shoulder pain also known breast cancer that is not to be treated at the patient's request patient is not a surgical candidate can undergo anesthesia so the right hip can't be fixed. Today's workup as a general screen no evidence urinary tract infection  electrolytes are very normal kidney functions very normal no leukocytosis or anemia. Chest x-ray without evidence of pneumonia pneumothorax or any evidence of metastasis. Patient's family will discuss situation with Dr. Juanetta Gosling they will call his office in the morning for assistance in appropriate care at home.      I personally performed the services described in this documentation, which was scribed in my presence. The recorded information has been reviewed and considered.     Ruth Jakes, MD 02/12/12 2135

## 2012-02-12 NOTE — ED Notes (Signed)
Pt back from x-ray.

## 2012-03-26 ENCOUNTER — Other Ambulatory Visit (HOSPITAL_COMMUNITY): Payer: Self-pay | Admitting: Pulmonary Disease

## 2012-03-26 DIAGNOSIS — C50919 Malignant neoplasm of unspecified site of unspecified female breast: Secondary | ICD-10-CM

## 2012-04-05 ENCOUNTER — Ambulatory Visit (HOSPITAL_COMMUNITY): Payer: 59

## 2012-08-21 ENCOUNTER — Other Ambulatory Visit: Payer: Self-pay | Admitting: Cardiology

## 2012-08-22 ENCOUNTER — Other Ambulatory Visit: Payer: Self-pay | Admitting: *Deleted

## 2012-08-29 ENCOUNTER — Other Ambulatory Visit: Payer: Self-pay | Admitting: Cardiology

## 2012-08-30 ENCOUNTER — Telehealth: Payer: Self-pay | Admitting: Cardiology

## 2012-08-30 ENCOUNTER — Other Ambulatory Visit: Payer: Self-pay | Admitting: *Deleted

## 2012-08-30 MED ORDER — PERINDOPRIL ERBUMINE 8 MG PO TABS: 16.0000 mg | ORAL_TABLET | Freq: Every day | ORAL | Status: DC

## 2012-08-30 MED ORDER — CILOSTAZOL 100 MG PO TABS: 100.0000 mg | ORAL_TABLET | Freq: Two times a day (BID) | ORAL | Status: AC

## 2012-08-30 MED ORDER — PERINDOPRIL ERBUMINE 8 MG PO TABS: 8.0000 mg | ORAL_TABLET | Freq: Every day | ORAL | Status: DC

## 2012-08-30 NOTE — Telephone Encounter (Signed)
Needs new RX for Cilosazol 100mg  and Perindotril 8mg .  Need to be quantity of 60 and not 30.. / tgs

## 2013-01-27 ENCOUNTER — Telehealth: Payer: Self-pay | Admitting: *Deleted

## 2013-01-27 NOTE — Telephone Encounter (Signed)
PT NEEDS TO HAVE CLEARANCE TO HAVE TEETH PULLED SHE NEEDS TO KNOW WHEN TO COME OFF PLAVIX. PT IS NOT DUE BACK FOR F/U UNTIL 08/2013  PT IS CURRANTLY ON HOSPICE CARE THEY COME OUT ONCE A WEEK TO CHECK ON HER. SHE ALSO HAS BREAST CANCER.

## 2013-01-27 NOTE — Telephone Encounter (Signed)
Please advise 

## 2013-01-27 NOTE — Telephone Encounter (Signed)
Doctors office needs clarification on when to hold Plavix. Please advise.

## 2013-01-27 NOTE — Telephone Encounter (Signed)
OK to hold Plavix. 

## 2013-01-29 ENCOUNTER — Encounter: Payer: Self-pay | Admitting: *Deleted

## 2013-01-29 NOTE — Telephone Encounter (Signed)
I spoke with pt daughter & she will hold her plavix 5 days prior to dental extraction. Pt is currently taking antibiotics b/c of abcess to the tooth Pt is in hospice daughter states with breast cancer. Aware of follow-up with Dr. Darl Householder in [facility] February 2015 place.  Letter faxed to Dr. Rexene Edison. Lewis clearing pt for dental extractions Mylo Red RN

## 2013-01-30 ENCOUNTER — Telehealth: Payer: Self-pay | Admitting: *Deleted

## 2013-01-30 MED ORDER — METOPROLOL SUCCINATE ER 50 MG PO TB24: 50.0000 mg | ORAL_TABLET | Freq: Every day | ORAL | Status: AC

## 2013-01-30 MED ORDER — PERINDOPRIL ERBUMINE 8 MG PO TABS: 16.0000 mg | ORAL_TABLET | Freq: Every day | ORAL | Status: AC

## 2013-01-30 MED ORDER — CLOPIDOGREL BISULFATE 75 MG PO TABS: 75.0000 mg | ORAL_TABLET | Freq: Every day | ORAL | Status: AC

## 2013-01-30 NOTE — Telephone Encounter (Signed)
Daughter calls for refills on Aceon, plavix,and Toprol Refills called in to [facility].  Mylo Red RN

## 2013-02-17 ENCOUNTER — Observation Stay (HOSPITAL_COMMUNITY): Admission: AD | Admit: 2013-02-17 | Payer: 59 | Source: Ambulatory Visit | Admitting: Pulmonary Disease

## 2013-07-03 DEATH — deceased

## 2013-07-17 ENCOUNTER — Telehealth: Payer: Self-pay

## 2013-07-17 NOTE — Telephone Encounter (Signed)
Patient past away @ [facility] per Obituary in [place] [employer] °

## 2013-12-30 IMAGING — CR DG CHEST 2V
2 series · 2 of 2 positions shown · non-contrast
Comparison: 12/23/2004

CLINICAL DATA: Weakness.  Breast cancer

CHEST - 2 VIEW

[view not recorded (1 of 2)]
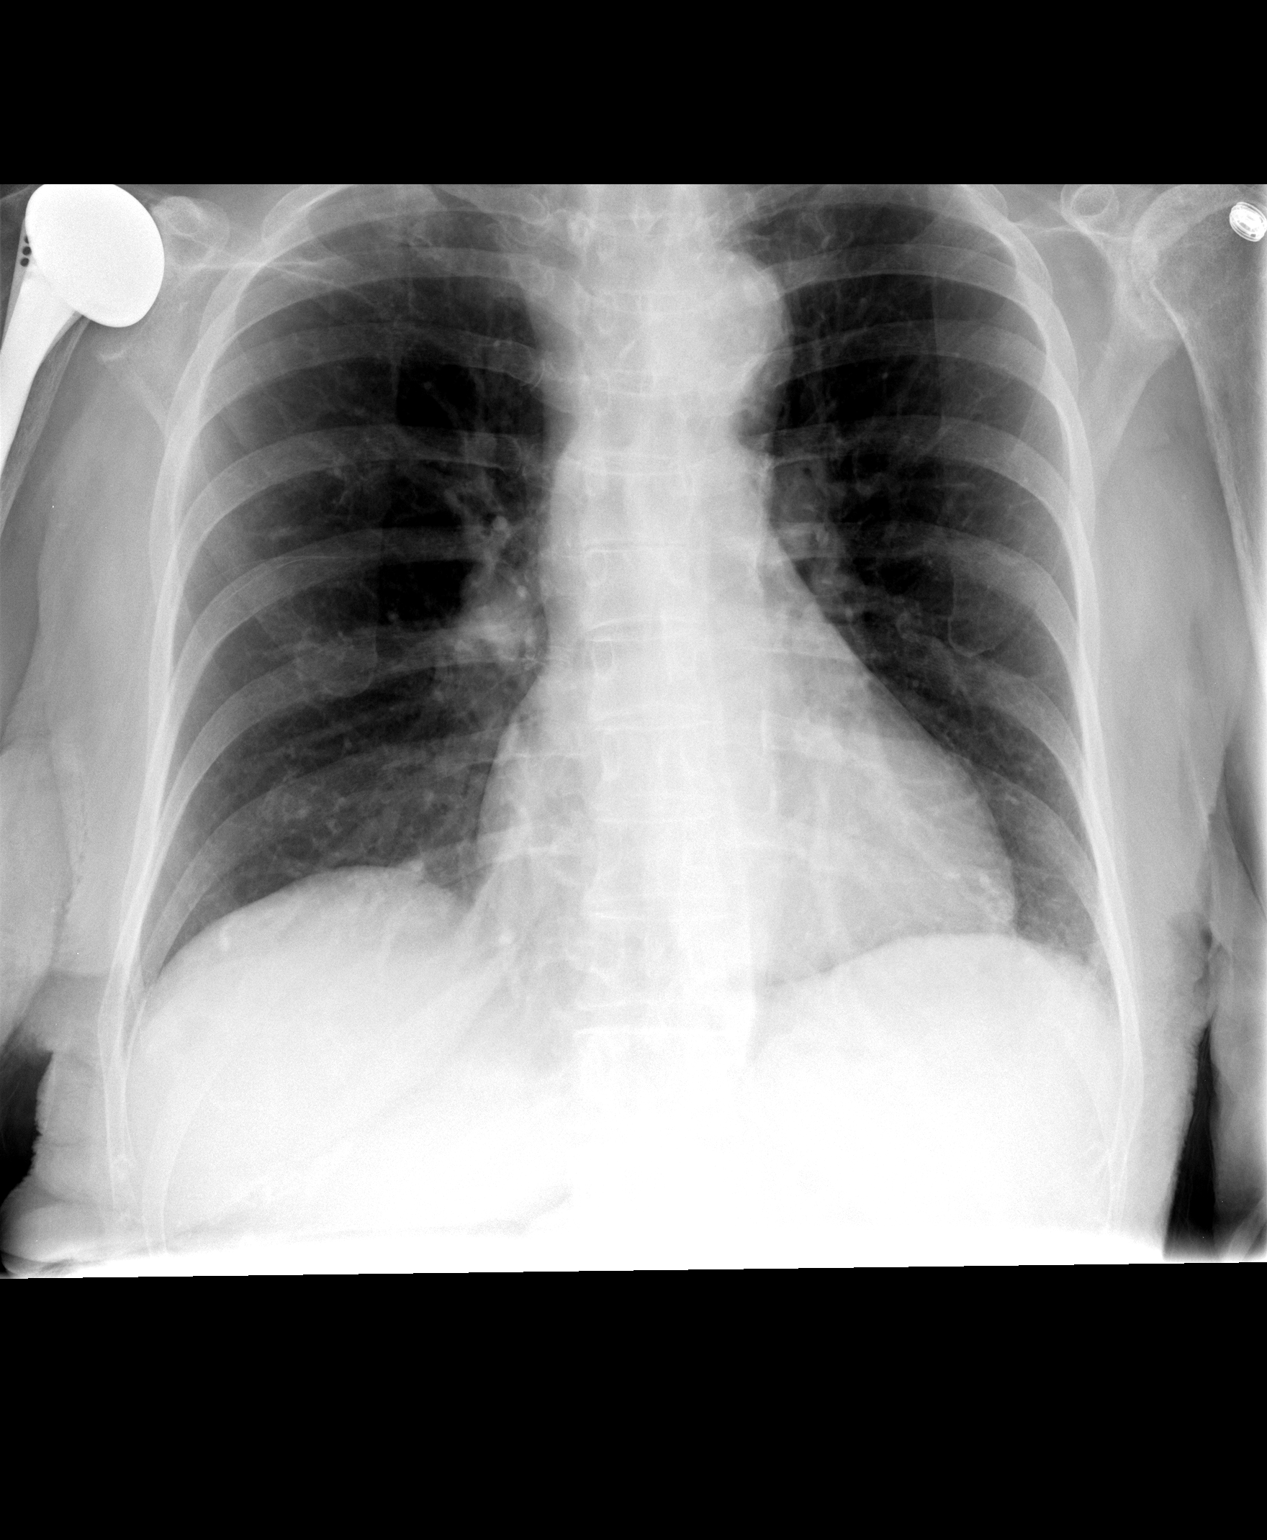

[view not recorded (2 of 2)]
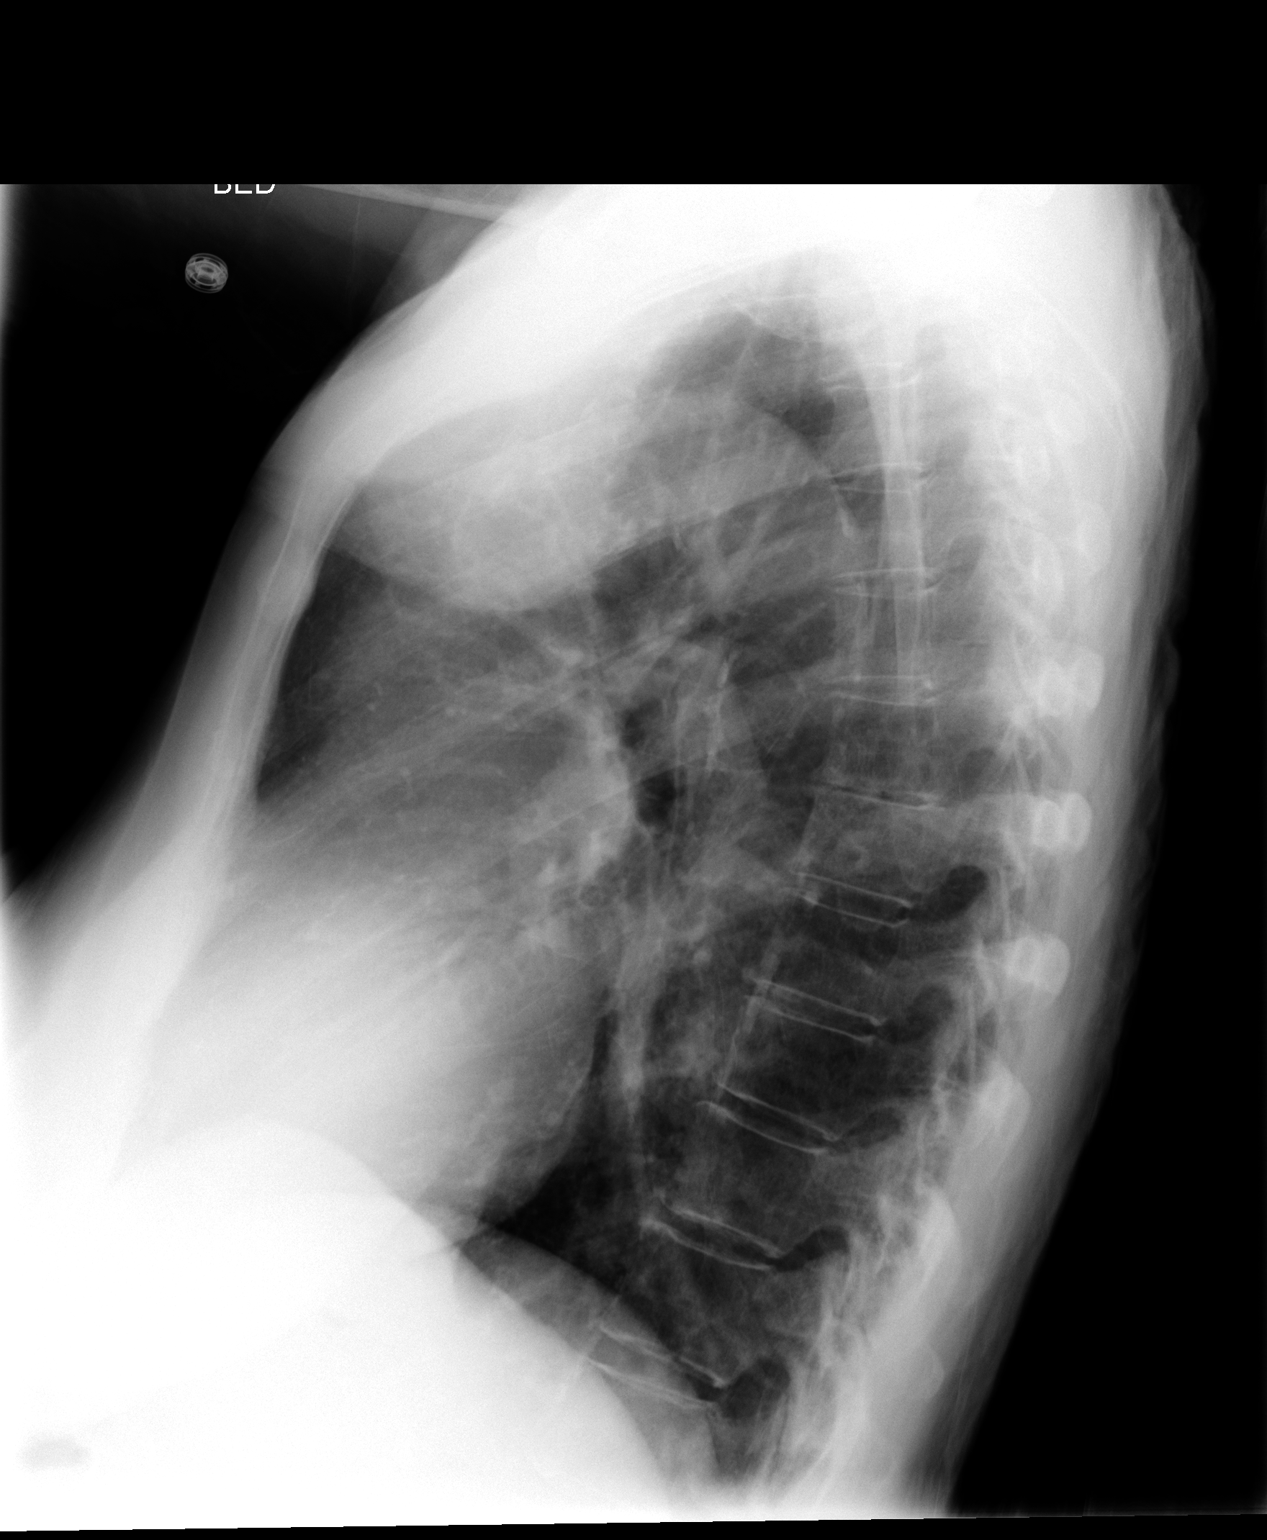

[2 of 2 positions shown; findings below may reference images not displayed]

FINDINGS: Heart size is upper normal.  Negative for heart failure.
Negative for pneumonia or mass.  Negative for pleural effusion.
Lungs are hyperinflated with changes of COPD.  Right shoulder
replacement.
IMPRESSION: No acute cardiopulmonary disease.
